# Patient Record
Sex: Male | Born: 1970
Health system: Southern US, Community
[De-identification: ages and names within clinical notes are randomized; demographics above are authoritative.]

## PROBLEM LIST (undated history)

## (undated) DIAGNOSIS — M1990 Unspecified osteoarthritis, unspecified site: Secondary | ICD-10-CM

## (undated) HISTORY — DX: Unspecified osteoarthritis, unspecified site: M19.90

---

## 2006-05-27 ENCOUNTER — Emergency Department (HOSPITAL_COMMUNITY): Admission: EM | Admit: 2006-05-27 | Discharge: 2006-05-27 | Payer: Self-pay | Admitting: Emergency Medicine

## 2016-02-16 ENCOUNTER — Encounter: Payer: Self-pay | Admitting: Family Medicine

## 2016-02-16 ENCOUNTER — Ambulatory Visit (INDEPENDENT_AMBULATORY_CARE_PROVIDER_SITE_OTHER): Payer: 59 | Admitting: Family Medicine

## 2016-02-16 VITALS — BP 110/75 | HR 90 | Temp 97.2°F | Ht 71.0 in | Wt 166.8 lb

## 2016-02-16 DIAGNOSIS — M62830 Muscle spasm of back: Secondary | ICD-10-CM

## 2016-02-16 DIAGNOSIS — Z0000 Encounter for general adult medical examination without abnormal findings: Secondary | ICD-10-CM | POA: Diagnosis not present

## 2016-02-16 DIAGNOSIS — D582 Other hemoglobinopathies: Secondary | ICD-10-CM

## 2016-02-16 DIAGNOSIS — D72829 Elevated white blood cell count, unspecified: Secondary | ICD-10-CM

## 2016-02-16 MED ORDER — TIZANIDINE HCL 4 MG PO CAPS: 4.0000 mg | ORAL_CAPSULE | Freq: Three times a day (TID) | ORAL | Status: DC | PRN

## 2016-02-16 MED ORDER — BACLOFEN 10 MG PO TABS: 10.0000 mg | ORAL_TABLET | Freq: Three times a day (TID) | ORAL | Status: DC

## 2016-02-16 MED ORDER — MELOXICAM 15 MG PO TABS: 15.0000 mg | ORAL_TABLET | Freq: Every day | ORAL | Status: DC

## 2016-02-16 NOTE — Progress Notes (Signed)
   HPI  Patient presents today to establish care with back spasms.  Expressive last 3 months he's had steadily worsening back pain.  He describes it as bilateral lower back pain that hurts with specific movements, he describes them as sharp stabbing type pains that go after a few moments, he feels them due to back spasms.  He was seen at urgent care about 2 weeks ago and treated with prednisone, Flexeril, and IM Depo-Medrol, he's only improved minimally.  He is a smoker, not willing to quit. He watches his diet carefully, he lost 50 pounds about a year ago. He exercises regularly at work but none outside of work.  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 [employer]  He had a plain film of the back 2 weeks ago at urgent care which showed only arthritis  PMH: Smoking status noted His past medical, surgical, social, family history reviewed and updated in EMR ROS: Per HPI  Objective: BP 110/75 mmHg  Pulse 90  Temp(Src) 97.2 F (36.2 C) (Oral)  Ht '5\' 11"'$  (1.803 m)  Wt 166 lb 12.8 oz (75.66 kg)  BMI 23.27 kg/m2 Gen: NAD, alert, cooperative with exam HEENT: NCAT, right TM obscured by cerumen, left TM normal, nares clear, oropharynx clear CV: RRR, good S1/S2, no murmur Resp: CTABL, no wheezes, non-labored Abd: SNTND, BS present, no guarding or organomegaly Ext: No edema, warm Neuro: Alert and oriented, strength 5/5 and sensation intact in bilateral lower extremities, 2+ patellar tendon reflexes, normal gait Musculoskeletal No tenderness to palpation of lumbar spine or paraspinal muscles  Assessment and plan:  # Back pain Likely muscle spasms Scheduled NSAIDs times a few weeks, also trybaclofen instead of Flexeril Heat and supportive care Stretching Basic labs Trial of vitamin B complex as well, can help muscle cramping Return in 3-4 weeks if not improving.  Annual Exam Normal exam Labs    Orders Placed This Encounter  Procedures  . CMP14+EGFR  .  CBC with Differential  . VITAMIN D 25 Hydroxy (Vit-D Deficiency, Fractures)  . Lipid Panel  . TSH    Meds ordered this encounter  Medications  . cyclobenzaprine (FLEXERIL) 10 MG tablet    Sig: Take 10 mg by mouth 3 (three) times daily as needed for muscle spasms.  . meloxicam (MOBIC) 15 MG tablet    Sig: Take 1 tablet (15 mg total) by mouth daily.    Dispense:  30 tablet    Refill:  0  . tiZANidine (ZANAFLEX) 4 MG capsule    Sig: Take 1 capsule (4 mg total) by mouth 3 (three) times daily as needed for muscle spasms.    Dispense:  60 capsule    Refill:  [place], MD [facility] 02/16/2016, 1:58 PM

## 2016-02-16 NOTE — Patient Instructions (Addendum)
Grea to meet you!  Try meloxicam daily for a few weeks, do not take any additional ibuprofen or aleve with it.   Try zanaflex as well, it may make you sleepy.   Lets follow up in 3-4 weeks to see how you are doing.   I would also recommend taking vitamin B complex (with at least 30 mg of B6) 3 times daily.   We will call within 1 week with your lab results.

## 2016-02-17 LAB — CMP14+EGFR
A/G RATIO: 1.9 (ref 1.2–2.2)
ALT: 11 IU/L (ref 0–44)
AST: 14 IU/L (ref 0–40)
Albumin: 4.6 g/dL (ref 3.5–5.5)
Alkaline Phosphatase: 100 IU/L (ref 39–117)
BUN/Creatinine Ratio: 16 (ref 9–20)
BUN: 14 mg/dL (ref 6–24)
Bilirubin Total: 0.3 mg/dL (ref 0.0–1.2)
CALCIUM: 9.4 mg/dL (ref 8.7–10.2)
CO2: 24 mmol/L (ref 18–29)
Chloride: 97 mmol/L (ref 96–106)
Creatinine, Ser: 0.87 mg/dL (ref 0.76–1.27)
GFR calc Af Amer: 120 mL/min/{1.73_m2} (ref >59)
GFR, EST NON AFRICAN AMERICAN: 103 mL/min/{1.73_m2} (ref >59)
GLUCOSE: 82 mg/dL (ref 65–99)
Globulin, Total: 2.4 g/dL (ref 1.5–4.5)
POTASSIUM: 4.7 mmol/L (ref 3.5–5.2)
Sodium: 140 mmol/L (ref 134–144)
TOTAL PROTEIN: 7 g/dL (ref 6.0–8.5)

## 2016-02-17 LAB — CBC WITH DIFFERENTIAL/PLATELET
BASOS ABS: 0 10*3/uL (ref 0.0–0.2)
BASOS: 0 %
EOS (ABSOLUTE): 0.2 10*3/uL (ref 0.0–0.4)
Eos: 2 %
HEMOGLOBIN: 18.6 g/dL — AB (ref 12.6–17.7)
Hematocrit: 51.9 % — ABNORMAL HIGH (ref 37.5–51.0)
IMMATURE GRANS (ABS): 0 10*3/uL (ref 0.0–0.1)
IMMATURE GRANULOCYTES: 0 %
LYMPHS: 20 %
Lymphocytes Absolute: 2.7 10*3/uL (ref 0.7–3.1)
MCH: 32.9 pg (ref 26.6–33.0)
MCHC: 35.8 g/dL — ABNORMAL HIGH (ref 31.5–35.7)
MCV: 92 fL (ref 79–97)
MONOCYTES: 4 %
Monocytes Absolute: 0.6 10*3/uL (ref 0.1–0.9)
NEUTROS PCT: 74 %
Neutrophils Absolute: 10.2 10*3/uL — ABNORMAL HIGH (ref 1.4–7.0)
PLATELETS: 281 10*3/uL (ref 150–379)
RBC: 5.66 x10E6/uL (ref 4.14–5.80)
RDW: 13.2 % (ref 12.3–15.4)
WBC: 13.8 10*3/uL — ABNORMAL HIGH (ref 3.4–10.8)

## 2016-02-17 LAB — LIPID PANEL
Chol/HDL Ratio: 3.7 ratio units (ref 0.0–5.0)
Cholesterol, Total: 145 mg/dL (ref 100–199)
HDL: 39 mg/dL — AB (ref >39)
LDL Calculated: 75 mg/dL (ref 0–99)
TRIGLYCERIDES: 154 mg/dL — AB (ref 0–149)
VLDL Cholesterol Cal: 31 mg/dL (ref 5–40)

## 2016-02-17 LAB — VITAMIN D 25 HYDROXY (VIT D DEFICIENCY, FRACTURES): VIT D 25 HYDROXY: 24.1 ng/mL — AB (ref 30.0–100.0)

## 2016-02-17 LAB — TSH: TSH: 1.55 u[IU]/mL (ref 0.450–4.500)

## 2016-02-19 ENCOUNTER — Other Ambulatory Visit: Payer: 59

## 2016-02-19 ENCOUNTER — Other Ambulatory Visit: Payer: Self-pay | Admitting: *Deleted

## 2016-02-19 DIAGNOSIS — D72829 Elevated white blood cell count, unspecified: Secondary | ICD-10-CM

## 2016-02-19 DIAGNOSIS — D582 Other hemoglobinopathies: Secondary | ICD-10-CM

## 2016-02-19 NOTE — Addendum Note (Signed)
Addended by: Tamera PuntWRAY, Keyna Blizard S on: 02/19/2016 09:09 AM   Modules accepted: Orders

## 2016-02-20 LAB — CBC WITH DIFFERENTIAL/PLATELET
Basophils Absolute: 0 10*3/uL (ref 0.0–0.2)
Basos: 0 %
EOS (ABSOLUTE): 0.3 10*3/uL (ref 0.0–0.4)
EOS: 2 %
HEMATOCRIT: 47.5 % (ref 37.5–51.0)
Hemoglobin: 16.8 g/dL (ref 12.6–17.7)
Immature Grans (Abs): 0 10*3/uL (ref 0.0–0.1)
Immature Granulocytes: 0 %
Lymphocytes Absolute: 2.8 10*3/uL (ref 0.7–3.1)
Lymphs: 19 %
MCH: 32.6 pg (ref 26.6–33.0)
MCHC: 35.4 g/dL (ref 31.5–35.7)
MCV: 92 fL (ref 79–97)
MONOS ABS: 0.9 10*3/uL (ref 0.1–0.9)
Monocytes: 6 %
NEUTROS PCT: 73 %
Neutrophils Absolute: 10.8 10*3/uL — ABNORMAL HIGH (ref 1.4–7.0)
Platelets: 253 10*3/uL (ref 150–379)
RBC: 5.15 x10E6/uL (ref 4.14–5.80)
RDW: 13.3 % (ref 12.3–15.4)
WBC: 14.8 10*3/uL — AB (ref 3.4–10.8)

## 2016-02-20 LAB — PATHOLOGIST SMEAR REVIEW
Path Rev PLTs: NORMAL
Path Rev RBC: NORMAL

## 2016-03-18 ENCOUNTER — Ambulatory Visit (INDEPENDENT_AMBULATORY_CARE_PROVIDER_SITE_OTHER): Payer: 59 | Admitting: Family Medicine

## 2016-03-18 ENCOUNTER — Encounter: Payer: Self-pay | Admitting: Family Medicine

## 2016-03-18 VITALS — BP 121/78 | HR 71 | Temp 98.1°F | Ht 71.0 in | Wt 165.2 lb

## 2016-03-18 DIAGNOSIS — E559 Vitamin D deficiency, unspecified: Secondary | ICD-10-CM | POA: Diagnosis not present

## 2016-03-18 DIAGNOSIS — M62830 Muscle spasm of back: Secondary | ICD-10-CM | POA: Diagnosis not present

## 2016-03-18 DIAGNOSIS — D72829 Elevated white blood cell count, unspecified: Secondary | ICD-10-CM | POA: Diagnosis not present

## 2016-03-18 MED ORDER — BACLOFEN 20 MG PO TABS
20.0000 mg | ORAL_TABLET | Freq: Three times a day (TID) | ORAL | Status: DC
Start: 2016-03-18 — End: 2016-04-15

## 2016-03-18 NOTE — Progress Notes (Signed)
   HPI  Patient presents today to follow-up for back pain, leukocytosis, and vitamin D deficiency.  Pain Improving slightly, he's got a light duty at work. He still has intermittent episodic mostly left-sided sharp lower thoracic/upper lumbar back pain. States that mostly it's better, however he does continue to have episodic spasms and pain. Meloxicam not really helping Baclofen doesn't seem to be helping much, however he has better overall.  Vitamin D deficiency Taking supplement as recommended.  Leukocytosis No signs of illness, cough, shortness of breath, rhinorrhea.  No problems with nocturia, weak stream, or urinary hesitancy  PMH: Smoking status noted ROS: Per HPI  Objective: BP 121/78 mmHg  Pulse 71  Temp(Src) 98.1 F (36.7 C) (Oral)  Ht 5\' 11"  (1.803 m)  Wt 165 lb 3.2 oz (74.934 kg)  BMI 23.05 kg/m2 Gen: NAD, alert, cooperative with exam HEENT: NCAT CV: RRR, good S1/S2, no murmur Resp: CTABL, no wheezes, non-labored Ext: No edema, warm Neuro: Alert and oriented, No gross deficits  MSK No tenderness to palp of her paraspinal muscles or lumbar/thoracic spine  Assessment and plan:  # Muscle spasm of the back Improving slightly Titrate baclofen, discontinue meloxicam Offered physical therapy, however with his job it's very difficult to pursue that. Consider MRI if still persistent at next visit.  # Leukocytosis No clear reason, consider physical stress given his difficult and physically challenging job Rechecking labs today Peripheral smear was also normal  # D deficiency Replacing with 1000 IU Vitamin D 3 daily   Meds ordered this encounter  Medications  . baclofen (LIORESAL) 20 MG tablet    Sig: Take 1 tablet (20 mg total) by mouth 3 (three) times daily.    Dispense:  90 tablet    Refill:  2    Murtis SinkSam Ticia Virgo, MD [facility] 03/18/2016, 3:23 PM

## 2016-03-18 NOTE — Patient Instructions (Signed)
Great to see you!  We will call with labs within 1 week  Increase the baclofen to 20 mg three times daily  Stop meloxicam for a few days, if you cant see a difference there is no need to take it.   Come back in 4 weeks or so

## 2016-03-19 LAB — CBC WITH DIFFERENTIAL/PLATELET
BASOS ABS: 0 10*3/uL (ref 0.0–0.2)
Basos: 0 %
EOS (ABSOLUTE): 0.2 10*3/uL (ref 0.0–0.4)
Eos: 2 %
HEMOGLOBIN: 17.1 g/dL (ref 12.6–17.7)
Hematocrit: 48.5 % (ref 37.5–51.0)
Immature Grans (Abs): 0 10*3/uL (ref 0.0–0.1)
Immature Granulocytes: 0 %
LYMPHS ABS: 2.8 10*3/uL (ref 0.7–3.1)
Lymphs: 21 %
MCH: 32.4 pg (ref 26.6–33.0)
MCHC: 35.3 g/dL (ref 31.5–35.7)
MCV: 92 fL (ref 79–97)
MONOS ABS: 0.6 10*3/uL (ref 0.1–0.9)
Monocytes: 5 %
NEUTROS ABS: 9.7 10*3/uL — AB (ref 1.4–7.0)
Neutrophils: 72 %
Platelets: 274 10*3/uL (ref 150–379)
RBC: 5.28 x10E6/uL (ref 4.14–5.80)
RDW: 13 % (ref 12.3–15.4)
WBC: 13.4 10*3/uL — AB (ref 3.4–10.8)

## 2016-04-02 ENCOUNTER — Telehealth: Payer: Self-pay | Admitting: Family Medicine

## 2016-04-02 NOTE — Telephone Encounter (Signed)
Appreciate FYI  Murtis SinkSam Kaleyah Labreck, MD [facility] 04/02/2016, 11:59 AM

## 2016-04-02 NOTE — Telephone Encounter (Signed)
Patient states that he started taking testosterone supplement OTC and it seems to be helping his condition some. Patient wanted us to let you know.

## 2016-04-15 ENCOUNTER — Encounter: Payer: Self-pay | Admitting: Family Medicine

## 2016-04-15 ENCOUNTER — Ambulatory Visit (INDEPENDENT_AMBULATORY_CARE_PROVIDER_SITE_OTHER): Payer: 59 | Admitting: Family Medicine

## 2016-04-15 VITALS — BP 104/70 | HR 77 | Temp 97.8°F | Ht 71.0 in | Wt 160.2 lb

## 2016-04-15 DIAGNOSIS — M545 Low back pain, unspecified: Secondary | ICD-10-CM

## 2016-04-15 DIAGNOSIS — D72829 Elevated white blood cell count, unspecified: Secondary | ICD-10-CM | POA: Diagnosis not present

## 2016-04-15 DIAGNOSIS — M546 Pain in thoracic spine: Secondary | ICD-10-CM | POA: Diagnosis not present

## 2016-04-15 LAB — CBC WITH DIFFERENTIAL/PLATELET
BASOS ABS: 0 10*3/uL (ref 0.0–0.2)
Basos: 0 %
EOS (ABSOLUTE): 0.3 10*3/uL (ref 0.0–0.4)
Eos: 2 %
HEMOGLOBIN: 17.6 g/dL (ref 12.6–17.7)
Hematocrit: 49.1 % (ref 37.5–51.0)
IMMATURE GRANS (ABS): 0 10*3/uL (ref 0.0–0.1)
Immature Granulocytes: 0 %
LYMPHS: 14 %
Lymphocytes Absolute: 2.2 10*3/uL (ref 0.7–3.1)
MCH: 33 pg (ref 26.6–33.0)
MCHC: 35.8 g/dL — AB (ref 31.5–35.7)
MCV: 92 fL (ref 79–97)
MONOCYTES: 7 %
Monocytes Absolute: 1 10*3/uL — ABNORMAL HIGH (ref 0.1–0.9)
Neutrophils Absolute: 11.8 10*3/uL — ABNORMAL HIGH (ref 1.4–7.0)
Neutrophils: 77 %
Platelets: 245 10*3/uL (ref 150–379)
RBC: 5.33 x10E6/uL (ref 4.14–5.80)
RDW: 13.1 % (ref 12.3–15.4)
WBC: 15.3 10*3/uL — AB (ref 3.4–10.8)

## 2016-04-15 MED ORDER — TIZANIDINE HCL 2 MG PO CAPS: 2.0000 mg | ORAL_CAPSULE | Freq: Three times a day (TID) | ORAL | Status: DC

## 2016-04-15 NOTE — Progress Notes (Signed)
HPI  Patient presents today for continued back pain.  Patient has been treated now for about 8 weeks with conservative therapy with not much improvement. He continues to have persistent midline lower thoracic and upper lumbar pain. He is pain is accompanied with severe intermittent muscle spasms.  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   He denies any fever, chills, sweats He's been taking 20-40 mg of baclofen daily with not much improvement. He also tried Flexeril and meloxicam with no much improvement.  Slight improvement when he stops working, he recently returned back to his usual job, he's been on light duty, and had recurrence of severe back spasms right away.  PMH: Smoking status noted ROS: Per HPI  Objective: BP 104/70 mmHg  Pulse 77  Temp(Src) 97.8 F (36.6 C) (Oral)  Ht  (1.803 m)  Wt 160 lb 3.2 oz (72.666 kg)  BMI 22.35 kg/m2 Gen: NAD, alert, cooperative with exam HEENT: NCAT CV: RRR, good S1/S2, no murmur Resp: CTABL, no wheezes, non-labored Ext: No edema, warm Neuro: Alert and oriented, strength 5/5 and sensation intact in bilateral lower extremities, 2+ patellar tendon reflexes Musculoskeletal No tenderness to palpation of midline thoracic or lumbar back, no tenderness to palpation of paraspinal muscles.  Assessment and plan:  # Persistent back pain Lumbar and thoracic, given no improvement with conservative therapy will order MRI to rule out more serious etiology Trial of tizanidine instead of baclofen, discussed weaning off of baclofen Recommended seeing sports medicine, Ayesha Mohair, for additional recommendations.  # Leukocytosis Elevated W BC, also hemoglobin initially, hemoglobin came down to the normal range and his WBC is prevent stable. No signs of active infection Continue to monitor  Orders Placed This Encounter  Procedures  . MR Thoracic Spine Wo Contrast    Standing Status: Future     Number of Occurrences:      Standing Expiration Date: 06/16/2017    Order Specific Question:  Reason for Exam (SYMPTOM  OR DIAGNOSIS REQUIRED)    Answer:  persistent chronic pain    Order Specific Question:  Preferred imaging location?    Answer:  [facility] (table limit-350lbs)    Order Specific Question:  What is the patient's sedation requirement?    Answer:  No Sedation    Order Specific Question:  Does the patient have a pacemaker or implanted devices?    Answer:  No  . MR Lumbar Spine Wo Contrast    Standing Status: Future     Number of Occurrences:      Standing Expiration Date: 06/16/2017    Order Specific Question:  Reason for Exam (SYMPTOM  OR DIAGNOSIS REQUIRED)    Answer:  persistent chronic back pain, lower thoracic/upper lumbar    Order Specific Question:  Preferred imaging location?    Answer:  [facility] (table limit-350lbs)    Order Specific Question:  What is the patient's sedation requirement?    Answer:  No Sedation    Order Specific Question:  Does the patient have a pacemaker or implanted devices?    Answer:  No  . CBC with Differential  . Ambulatory referral to Sports Medicine    Referral Priority:  Routine    Referral Type:  Consultation    Number of Visits Requested:  1    Meds ordered this encounter  Medications  . multivitamin-iron-minerals-folic acid (CENTRUM) chewable tablet    Sig: Dorna Bloom  1 tablet by mouth daily.  . tizanidine (ZANAFLEX) 2 MG capsule    Sig: Take 1 capsule (2 mg total) by mouth 3 (three) times daily.    Dispense:  30 capsule    Refill:  2    Murtis SinkSam Bradshaw, MD [facility] 04/15/2016, 10:58 AM

## 2016-04-15 NOTE — Patient Instructions (Signed)
Great to see you!  We will work on an MRI and the referral for sports medicine.   I have sent zanaflex for your spasms, you can use it as needed.

## 2016-04-29 ENCOUNTER — Ambulatory Visit (HOSPITAL_COMMUNITY)
Admission: RE | Admit: 2016-04-29 | Discharge: 2016-04-29 | Disposition: A | Discharge status: Home / Self Care | Payer: 59 | Source: Ambulatory Visit | Attending: Family Medicine | Admitting: Family Medicine

## 2016-04-29 DIAGNOSIS — M5126 Other intervertebral disc displacement, lumbar region: Secondary | ICD-10-CM | POA: Diagnosis not present

## 2016-04-29 DIAGNOSIS — M545 Low back pain, unspecified: Secondary | ICD-10-CM

## 2016-04-29 DIAGNOSIS — M546 Pain in thoracic spine: Secondary | ICD-10-CM

## 2016-04-29 DIAGNOSIS — M5124 Other intervertebral disc displacement, thoracic region: Secondary | ICD-10-CM | POA: Insufficient documentation

## 2016-04-29 DIAGNOSIS — M5137 Other intervertebral disc degeneration, lumbosacral region: Secondary | ICD-10-CM | POA: Diagnosis not present

## 2016-05-06 NOTE — Progress Notes (Signed)
[name] [address] [place], [place] [address] Phone: [phone] Subjective:    I'm seeing this patient by the request  of:  Kevin Fenton, MD   CC: Low back pain   [id number]  Drew West is a 45 y.o. male coming in with complaint of mid and low back pain    Already tried Baclofen 20mg  3 times daily, flexeril and gabapentin with minimal improvement. Went on light duty and  Was a little better but worse when starting job again. Patient has not been on light duty for approximately 10 weeks. Patient's on primary care provider has tried some over-the-counter medications and some mild exercises with very mild improvement. Was sent for x-rays showing very mild arthritic changes. Also sent for an MRI  MRI ordered.  Independently reviewed by me Thoracic chronic DDD.  Lumbar  1. Small broad-based disc bulge at L4-5 with a central annular fissure with minimal indentation upon the thecal sac without nerve root impingement. 2. Chronic degenerative disc disease at L5-S1 without neural Impingement.  Patient states that this seems to have significant tightness in the mornings. Seems to better throughout the day but unfortunately after approximately 2 or 3 PM in the afternoon he starts having increasing tightness again that can be significantly severe. Patient denies any significant radiation down the legs at the moment. Seems to stay localized in the lower back. Patient rates the severity of pain at all times is 3 out of 10 but can have severity is low bad is 8 out of 10. Has been debilitating. 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  Has had laboratory workup including CBC showing elevated white blood cell count but peripheral smear was unremarkable. Patient also found to have a low vitamin D level.  Past Medical History:  Diagnosis Date  . Arthritis    lower back, right ankle   No past surgical  history on file. Social History   Social History  . Marital status: Single    Spouse name: N/A  . Number of children: N/A  . Years of education: N/A   Social History Main Topics  . Smoking status: Current Every Day Smoker    Packs/day: 0.25    Types: Cigars  . Smokeless tobacco: None  . Alcohol use Yes  . Drug use: No  . Sexual activity: Not Asked   Other Topics Concern  . None   Social History Narrative  . None   Not on File Family History  Problem Relation Age of Onset  . Stroke Father   . Diabetes Father   . Heart disease Father   . Depression Sister   . Bipolar disorder Sister     Past medical history, social, surgical and family history all reviewed in electronic medical record.  No pertanent information unless stated regarding to the chief complaint.   Review of Systems: No headache, visual changes, nausea, vomiting, diarrhea, constipation, dizziness, abdominal pain, skin rash, fevers, chills, night sweats, weight loss, swollen lymph nodes, body aches, joint swelling, muscle aches, chest pain, shortness of breath, mood changes.   Objective  Blood pressure 112/74, pulse 80, height 5\' 11"  (1.803 m), weight 160 lb (72.6 kg), SpO2 95 %.  General: No apparent distress alert and oriented x3 mood and affect normal, dressed appropriately.  HEENT: Pupils equal, extraocular movements intact  Respiratory: Patient's speak in full sentences and does not appear short of breath  Cardiovascular: No  lower extremity edema, non tender, no erythema  Skin: Warm dry intact with no signs of infection or rash on extremities or on axial skeleton.  Abdomen: Soft nontender  Neuro: Cranial nerves II through XII are intact, neurovascularly intact in all extremities with 2+ DTRs and 2+ pulses.  Lymph: No lymphadenopathy of posterior or anterior cervical chain or axillae bilaterally.  Gait normal with good balance and coordination. She guarded and patient is slow from going from a seated to  standing position secondary to tightness. MSK:  Non tender with full range of motion and good stability and symmetric strength and tone of shoulders, elbows, wrist, hip, knee and ankles bilaterally.  Back Exam:  Inspection: Unremarkable  Motion: Flexion 30 deg, Extension 15 deg, Side Bending to 25 deg bilaterally,  Rotation to 25 deg bilaterally  SLR laying: Negative  XSLR laying: Negative  Palpable tenderness: Tenderness over the paraspinal musculature of the lumbar spine do feel the right side.Marland Kitchen FABER: Moderately positive on the left with tightness but pain on the right side of the back. Sensory change: Gross sensation intact to all lumbar and sacral dermatomes.  Reflexes: 2+ at both patellar tendons, 2+ at achilles tendons, Babinski's downgoing.  Strength at foot  Plantar-flexion: 5/5 Dorsi-flexion: 5/5 Eversion: 5/5 Inversion: 5/5  Leg strength  Quad: 5/5 Hamstring: 5/5 Hip flexor: 5/5 Hip abductors: 4/5  but symmetric     Impression and Recommendations:     This case required medical decision making of moderate complexity.      Note: This dictation was prepared with Dragon dictation along with smaller phrase technology. Any transcriptional errors that result from this process are unintentional.

## 2016-05-07 ENCOUNTER — Ambulatory Visit (INDEPENDENT_AMBULATORY_CARE_PROVIDER_SITE_OTHER): Payer: 59 | Admitting: Family Medicine

## 2016-05-07 ENCOUNTER — Encounter: Payer: Self-pay | Admitting: *Deleted

## 2016-05-07 ENCOUNTER — Encounter: Payer: Self-pay | Admitting: Family Medicine

## 2016-05-07 DIAGNOSIS — D72829 Elevated white blood cell count, unspecified: Secondary | ICD-10-CM | POA: Diagnosis not present

## 2016-05-07 DIAGNOSIS — M5136 Other intervertebral disc degeneration, lumbar region: Secondary | ICD-10-CM

## 2016-05-07 DIAGNOSIS — E559 Vitamin D deficiency, unspecified: Secondary | ICD-10-CM | POA: Diagnosis not present

## 2016-05-07 MED ORDER — PREDNISONE 50 MG PO TABS: 50.0000 mg | ORAL_TABLET | Freq: Every day | ORAL | 0 refills | Status: DC

## 2016-05-07 MED ORDER — GABAPENTIN 100 MG PO CAPS: 200.0000 mg | ORAL_CAPSULE | Freq: Every day | ORAL | 3 refills | Status: DC

## 2016-05-07 MED ORDER — VITAMIN D (ERGOCALCIFEROL) 1.25 MG (50000 UNIT) PO CAPS: 50000.0000 [IU] | ORAL_CAPSULE | ORAL | 0 refills | Status: DC

## 2016-05-07 NOTE — Assessment & Plan Note (Signed)
Started on once weekly vitamin D  

## 2016-05-07 NOTE — Assessment & Plan Note (Signed)
Patient does have some very small bulging disc as well as some degenerative disc disease at multiple levels of the lumbar spine and mild during the thoracic spine. Patient also had vitamin D deficiency and we will replace. Patient given prednisone as well as gabapentin to see if this will be make any significant improvement. Warned him about the white blood cell count and not to check a meantime in the next 2-3 weeks. We discussed with patient that this will likely decrease any inflammation that is contribute in. We discussed icing regimen, given home exercises that I think will be beneficial with range of motion. Patient will come back and see me again in 2 weeks. Patient will be put on light duty at this time. If doing better we can consider formal physical therapy. Worsening we'll consider an epidural at L3-L4. Also would consider further laboratory workup to make sure there is no significant underlying pathology that could also be contributing with patient fairly significant arthritis for his age.

## 2016-05-07 NOTE — Assessment & Plan Note (Signed)
Being followed by primary care provider.

## 2016-05-07 NOTE — Patient Instructions (Addendum)
Good to see you.  Ice 20 minutes 2 times daily. Usually after activity and before bed. Exercises 3 times a week. I would consider physical therapy as well if you have trouble with the exercises on your own.  I would like to try Prednisone daily for 5 days Gabapentin 200mg  at night Once weekly vitamin D for next 12 weeks.  Then over the counter get Turmeric 500mg  twice daily and take after the prednisone.  See me again in 2 weeks but if not better in 1 week call me and we will order the epidural

## 2016-05-20 NOTE — Progress Notes (Signed)
Drew West D.O. [facility] [address] [place], [place] [address] Phone: [phone] Subjective:    I'm seeing this patient by the request  of:  Drew FentonSamuel Bradshaw, MD   CC: Low back pain Follow-up  BJY:[id number]:Subjective  Drew HuhJonathan West is a 45 y.o. male coming in with complaint of mid and low back pain    Already tried Baclofen 20mg  3 times daily, flexeril and gabapentin with minimal improvement. Went on light duty and  Was a little better but worse when starting job again. Patient has not been on light duty for approximately 10 weeks. Patient's on primary care provider has tried some over-the-counter medications and some mild exercises with very mild improvement. Was sent for x-rays showing very mild arthritic changes. Also sent for an MRI  MRI ordered.  Independently reviewed by me Thoracic chronic DDD.  Lumbar   fissure with minimal indentation upon the thecal sac without nerveroot impingement. 2. Chronic degenerative disc disease at L5-S1 without neural Impingement.  Patient was having more tightness overall. Patient was given once weekly vitamin D, prednisone and gabapentin. Patient states when he was on the prednisone significantly better. Now only approximate tendon 20% better. Since then he has days when her good and days when her back. Still seems to be localized. Has been doing light duty still at work.  Has had laboratory workup including CBC showing elevated white blood cell count but peripheral smear was unremarkable. Patient also found to have a low vitamin D level.  Past Medical History:  Diagnosis Date  . Arthritis    lower back, right ankle   No past surgical history on file. Social History   Social History  . Marital status: Single    Spouse name: N/A  . Number of children: N/A  . Years of education: N/A   Social History Main Topics  . Smoking status: Current Every Day Smoker    Packs/day: 0.25    Types: Cigars  . Smokeless tobacco: None  .  Alcohol use Yes  . Drug use: No  . Sexual activity: Not Asked   Other Topics Concern  . None   Social History Narrative  . None   Not on File Family History  Problem Relation Age of Onset  . Stroke Father   . Diabetes Father   . Heart disease Father   . Depression Sister   . Bipolar disorder Sister     Past medical history, social, surgical and family history all reviewed in electronic medical record.  No pertanent information unless stated regarding to the chief complaint.   Review of Systems: No headache, visual changes, nausea, vomiting, diarrhea, constipation, dizziness, abdominal pain, skin rash, fevers, chills, night sweats, weight loss, swollen lymph nodes, body aches, joint swelling, muscle aches, chest pain, shortness of breath, mood changes.   Objective  Blood pressure 114/72, pulse 80, weight 159 lb (72.1 kg), SpO2 95 %.  General: No apparent distress alert and oriented x3 mood and affect normal, dressed appropriately.  HEENT: Pupils equal, extraocular movements intact  Respiratory: Patient's speak in full sentences and does not appear short of breath  Cardiovascular: No lower extremity edema, non tender, no erythema  Skin: Warm dry intact with no signs of infection or rash on extremities or on axial skeleton.  Abdomen: Soft nontender  Neuro: Cranial nerves II through XII are intact, neurovascularly intact in all extremities with 2+ DTRs and 2+ pulses.  Lymph: No lymphadenopathy of posterior or anterior cervical chain or axillae bilaterally.  Gait normal with good balance and coordination. She guarded and patient is slow from going from a seated to standing position secondary to tightness. MSK:  Non tender with full range of motion and good stability and symmetric strength and tone of shoulders, elbows, wrist, hip, knee and ankles bilaterally.  Back Exam:  Inspection: Unremarkable  Motion: Flexion 30 deg, Extension 15 deg, Side Bending to 25 deg bilaterally,    Rotation to 25 deg bilaterally No improvement in range of motion SLR laying: Negative  XSLR laying: Negative  Palpable tenderness: Tenderness over the paraspinal musculature of the lumbar spine still present with mild changes. FABER: Positive left Sensory change: Gross sensation intact to all lumbar and sacral dermatomes.  Reflexes: 2+ at both patellar tendons, 2+ at achilles tendons, Babinski's downgoing.  Strength at foot  Plantar-flexion: 5/5 Dorsi-flexion: 5/5 Eversion: 5/5 Inversion: 5/5  Leg strength  Quad: 5/5 Hamstring: 5/5 Hip flexor: 5/5 Hip abductors: 4/5  but symmetric  Very minimal change from previous exam.    Impression and Recommendations:     This case required medical decision making of moderate complexity.      Note: This dictation was prepared with Dragon dictation along with smaller phrase technology. Any transcriptional errors that result from this process are unintentional.

## 2016-05-21 ENCOUNTER — Ambulatory Visit (INDEPENDENT_AMBULATORY_CARE_PROVIDER_SITE_OTHER): Payer: 59 | Admitting: Family Medicine

## 2016-05-21 ENCOUNTER — Other Ambulatory Visit (INDEPENDENT_AMBULATORY_CARE_PROVIDER_SITE_OTHER): Payer: 59

## 2016-05-21 ENCOUNTER — Encounter: Payer: Self-pay | Admitting: Family Medicine

## 2016-05-21 VITALS — BP 114/72 | HR 80 | Wt 159.0 lb

## 2016-05-21 DIAGNOSIS — M5136 Other intervertebral disc degeneration, lumbar region: Secondary | ICD-10-CM

## 2016-05-21 DIAGNOSIS — M2550 Pain in unspecified joint: Secondary | ICD-10-CM

## 2016-05-21 LAB — TESTOSTERONE: Testosterone: 210.2 ng/dL — ABNORMAL LOW (ref 300.00–890.00)

## 2016-05-21 LAB — C-REACTIVE PROTEIN: CRP: 0.5 mg/dL (ref 0.5–20.0)

## 2016-05-21 LAB — SEDIMENTATION RATE: SED RATE: 17 mm/h — AB (ref 0–15)

## 2016-05-21 NOTE — Patient Instructions (Signed)
God to see you  Gustavus Bryantce is your friend Keep up with the exercises We will get the epidural Duexis up to 3 times daily  We will get labs downstairs as well.  See me again 2 weeks after the injection and we will make sure you are improving.

## 2016-05-21 NOTE — Assessment & Plan Note (Signed)
Discussed with patient at great length. At this time we will get labs to further evaluate for this and back pain that does not seem to be responding to conservative therapy. Patient will continue with the once weekly vitamin D. Patient can continue with the gabapentin but states that it was hurting him stomach. We discussed with patient that we would like to consider doing epidural. Patient did have disorder. I hope that this will be beneficial. Patient come back again in 2-3 weeks after the epidural we'll see how patient response.

## 2016-05-22 ENCOUNTER — Telehealth: Payer: Self-pay | Admitting: Emergency Medicine

## 2016-05-22 LAB — PSA, TOTAL AND FREE
PSA, % FREE: 40 % (ref >25)
PSA, Free: 0.2 ng/mL
PSA, Total: 0.5 ng/mL (ref <=4.0)

## 2016-05-22 LAB — ANA: Anti Nuclear Antibody(ANA): NEGATIVE

## 2016-05-22 LAB — RHEUMATOID FACTOR: Rhuematoid fact SerPl-aCnc: <10 IU/mL (ref <=14)

## 2016-05-22 NOTE — Telephone Encounter (Signed)
Pt called and stated DR Katrinka BlazingSmith was suppose to write him a prescription yesterday but the pharmacy hasnt received anything. Can you look and see what he is talking about. Thanks.

## 2016-05-23 ENCOUNTER — Encounter: Payer: Self-pay | Admitting: Family Medicine

## 2016-05-23 ENCOUNTER — Encounter: Payer: Self-pay | Admitting: *Deleted

## 2016-05-23 MED ORDER — IBUPROFEN-FAMOTIDINE 800-26.6 MG PO TABS: ORAL_TABLET | ORAL | 1 refills | Status: DC

## 2016-05-23 NOTE — Telephone Encounter (Signed)
duexis sent into pharmacy.

## 2016-05-27 ENCOUNTER — Encounter: Payer: Self-pay | Admitting: Family Medicine

## 2016-05-27 NOTE — Progress Notes (Signed)
All labs for auto immune normal.  Sed rate is minor elevated but likely smoking.  Testosterone only one low.

## 2016-05-28 ENCOUNTER — Encounter: Payer: Self-pay | Admitting: Family Medicine

## 2016-05-29 ENCOUNTER — Ambulatory Visit
Admission: RE | Admit: 2016-05-29 | Discharge: 2016-05-29 | Disposition: A | Discharge status: Home / Self Care | Payer: 59 | Source: Ambulatory Visit | Attending: Family Medicine | Admitting: Family Medicine

## 2016-05-29 DIAGNOSIS — M5136 Other intervertebral disc degeneration, lumbar region: Secondary | ICD-10-CM

## 2016-05-29 MED ORDER — METHYLPREDNISOLONE ACETATE 40 MG/ML INJ SUSP (RADIOLOG
120.0000 mg | Freq: Once | INTRAMUSCULAR | Status: AC
  Administered 2016-05-29: 120 mg via EPIDURAL

## 2016-05-29 MED ORDER — IOPAMIDOL (ISOVUE-M 200) INJECTION 41%
1.0000 mL | Freq: Once | INTRAMUSCULAR | Status: AC
  Administered 2016-05-29: 1 mL via EPIDURAL

## 2016-05-29 NOTE — Discharge Instructions (Signed)

## 2016-06-06 NOTE — Progress Notes (Signed)
Drew ScaleZach West D.O. [facility] [address] [place], [place] [address] Phone: [phone] Subjective:    I'm seeing this patient by the request  of:  Kevin FentonSamuel Bradshaw, MD   CC: Low back pain Follow-up  OZH:[id number]:Subjective  Drew HuhJonathan West is a 45 y.o. male coming in with complaint of mid and low back pain    Already tried Baclofen 20mg  3 times daily, flexeril and gabapentin with minimal improvement. Went on light duty and  Was a little better but worse when starting job again. Patient has not been on light duty for approximately 10 weeks. Patient's on primary care provider has tried some over-the-counter medications and some mild exercises with very mild improvement. Was sent for x-rays showing very mild arthritic changes. Also sent for an MRI  MRI ordered.  Independently reviewed by me Thoracic chronic DDD. Lumbar . 2. Chronic degenerative disc disease at L5-S1 without neural Impingement. Patient elected to have an epidural. States that he is feeling 60-70% better. This has been for quite some time. Patient started doing some increasing activities at work.  Laboratory workup also showed patient had a low testosterone level.  Has had laboratory workup including CBC showing elevated white blood cell count but peripheral smear was unremarkable. Patient also found to have a low vitamin D level.  Past Medical History:  Diagnosis Date  . Arthritis    lower back, right ankle   No past surgical history on file. Social History   Social History  . Marital status: Single    Spouse name: N/A  . Number of children: N/A  . Years of education: N/A   Social History Main Topics  . Smoking status: Current Every Day Smoker    Packs/day: 0.25    Types: Cigars  . Smokeless tobacco: Not on file  . Alcohol use Yes  . Drug use: No  . Sexual activity: Not on file   Other Topics Concern  . Not on file   Social History Narrative  . No narrative on file   No Known Allergies Family  History  Problem Relation Age of Onset  . Stroke Father   . Diabetes Father   . Heart disease Father   . Depression Sister   . Bipolar disorder Sister     Past medical history, social, surgical and family history all reviewed in electronic medical record.  No pertanent information unless stated regarding to the chief complaint.   Review of Systems: No headache, visual changes, nausea, vomiting, diarrhea, constipation, dizziness, abdominal pain, skin rash, fevers, chills, night sweats, weight loss, swollen lymph nodes, body aches, joint swelling, muscle aches, chest pain, shortness of breath, mood changes.   Objective  There were no vitals taken for this visit.  General: No apparent distress alert and oriented x3 mood and affect normal, dressed appropriately.  HEENT: Pupils equal, extraocular movements intact  Respiratory: Patient's speak in full sentences and does not appear short of breath  Cardiovascular: No lower extremity edema, non tender, no erythema  Skin: Warm dry intact with no signs of infection or rash on extremities or on axial skeleton.  Abdomen: Soft nontender  Neuro: Cranial nerves II through XII are intact, neurovascularly intact in all extremities with 2+ DTRs and 2+ pulses.  Lymph: No lymphadenopathy of posterior or anterior cervical chain or axillae bilaterally.  Gait normal with good balance and coordination. She guarded and patient is slow from going from a seated to standing position secondary to tightness. MSK:  Non tender with  full range of motion and good stability and symmetric strength and tone of shoulders, elbows, wrist, hip, knee and ankles bilaterally.  Back Exam:  Inspection: Unremarkable  Motion: Flexion 30 deg, Extension 20 deg, Side Bending to 29 deg bilaterally,  Rotation to 29 deg bilaterally improvement in range of motion SLR laying: Negative  XSLR laying: Negative  Palpable tenderness: Minimal tenderness to palpation in the [facility] bone  musculature. Improvement FABER: Positive left Sensory change: Gross sensation intact to all lumbar and sacral dermatomes.  Reflexes: 2+ at both patellar tendons, 2+ at achilles tendons, Babinski's downgoing.  Strength at foot  Plantar-flexion: 5/5 Dorsi-flexion: 5/5 Eversion: 5/5 Inversion: 5/5  Leg strength  Quad: 5/5 Hamstring: 5/5 Hip flexor: 5/5 Hip abductors: 4/5  but symmetric Improvement from previous exam.    Impression and Recommendations:     This case required medical decision making of moderate complexity.      Note: This dictation was prepared with Dragon dictation along with smaller phrase technology. Any transcriptional errors that result from this process are unintentional.

## 2016-06-07 ENCOUNTER — Ambulatory Visit (INDEPENDENT_AMBULATORY_CARE_PROVIDER_SITE_OTHER): Payer: 59 | Admitting: Family Medicine

## 2016-06-07 ENCOUNTER — Encounter: Payer: Self-pay | Admitting: *Deleted

## 2016-06-07 ENCOUNTER — Encounter: Payer: Self-pay | Admitting: Family Medicine

## 2016-06-07 DIAGNOSIS — M5136 Other intervertebral disc degeneration, lumbar region: Secondary | ICD-10-CM

## 2016-06-07 NOTE — Assessment & Plan Note (Signed)
Responded fairly well to the epidural steroid injection. We discussed with patient that we will release him to full duty and see how he does. We discussed icing regimen and home exercises. We discussed which activities to do an which was to avoid. Patient has any worsening spasms we also discussed the possibility of treating his low testosterone and he will discuss with primary care provider. Patient and will come back and see me again in 4-6 weeks if not completely resolved.  Spent  25 minutes with patient face-to-face and had greater than 50% of counseling including as described above in assessment and plan. Discuss once again ergonomics as well as proper lifting techniques for his degenerative disc disease.

## 2016-06-07 NOTE — Patient Instructions (Signed)
Good to see you  You are doing so much better DHEA 50 mg daily for next month.  Talk to Dr. Ermalinda MemosBradshaw about your testosterone and see what he thinks.  Note for work given  We can repeat injection or the prednisone if worsening pain  Try to titrate off the duexis if you can.  Anytime worsening pain then go 3 times a day for 3 days.  Write me or see me when you need me.

## 2017-01-10 IMAGING — MR MR THORACIC SPINE W/O CM
4 of 7 series · 16 of 48 positions shown · non-contrast
Comparison: Chest x-ray dated 05/27/2006

CLINICAL DATA: Chronic thoracic and lumbar back pain for 10 months.

EXAM:
MRI THORACIC SPINE WITHOUT CONTRAST
TECHNIQUE: Multiplanar, multisequence MR imaging of the thoracic spine was
performed. No intravenous contrast was administered.

[Series 6: T1 · sagittal · 4.0mm · 0.78mm/px · 3 of 13 slices shown]
[im 1/13]
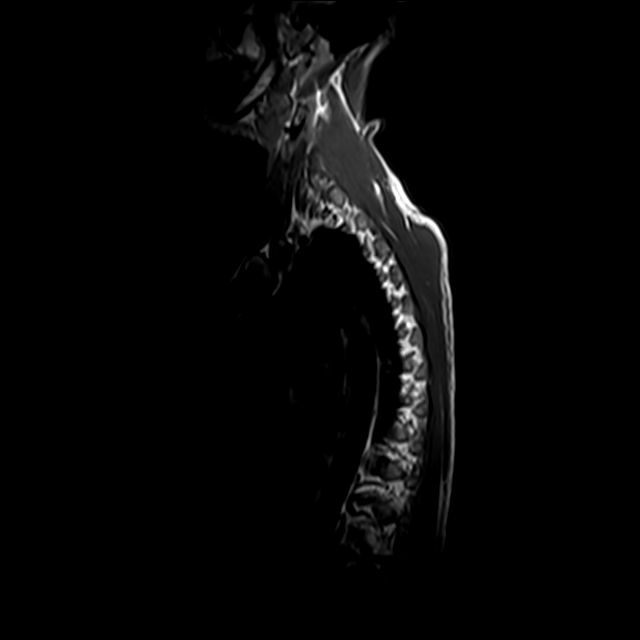
[im 9/13]
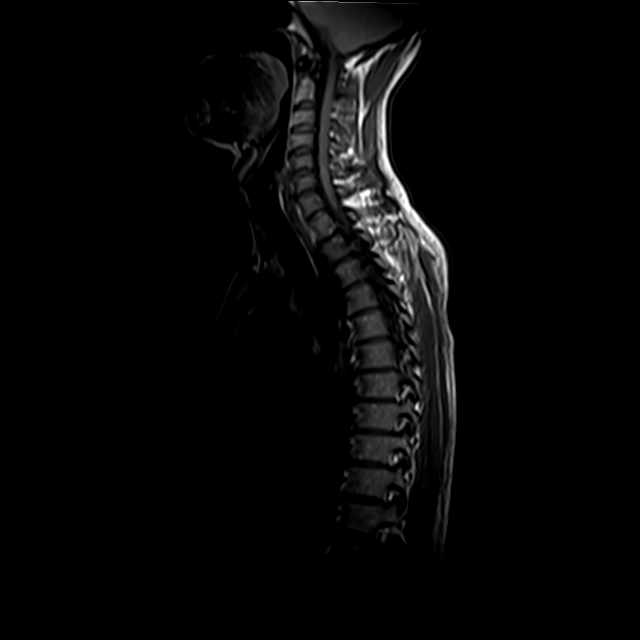
[im 13/13]
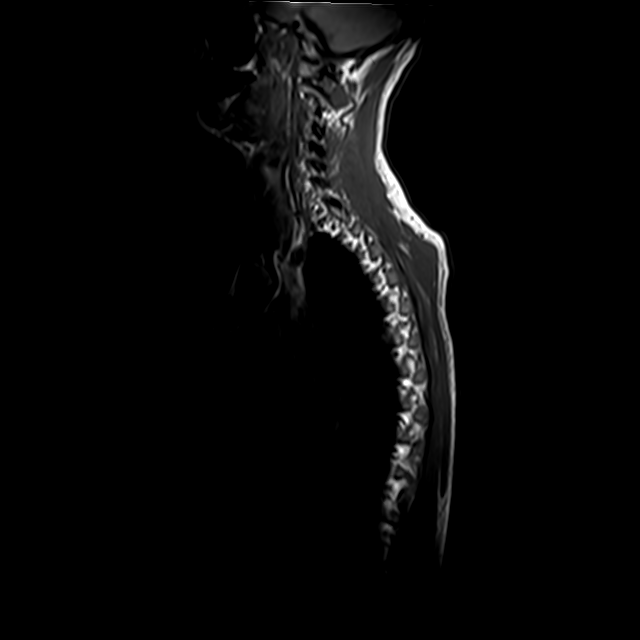

[Series 7: T2 · sagittal · 4.0mm · 0.55mm/px · 4 of 13 slices shown (1 of 3)]
[im 1/13]
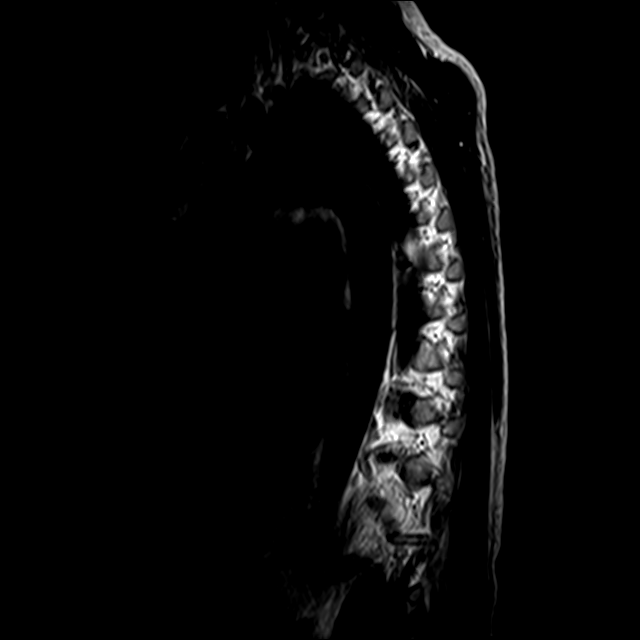
[im 5/13]
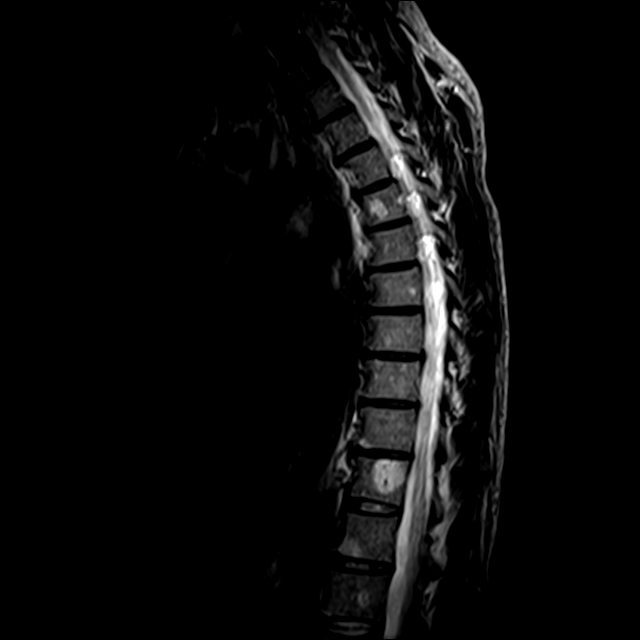
[im 9/13]
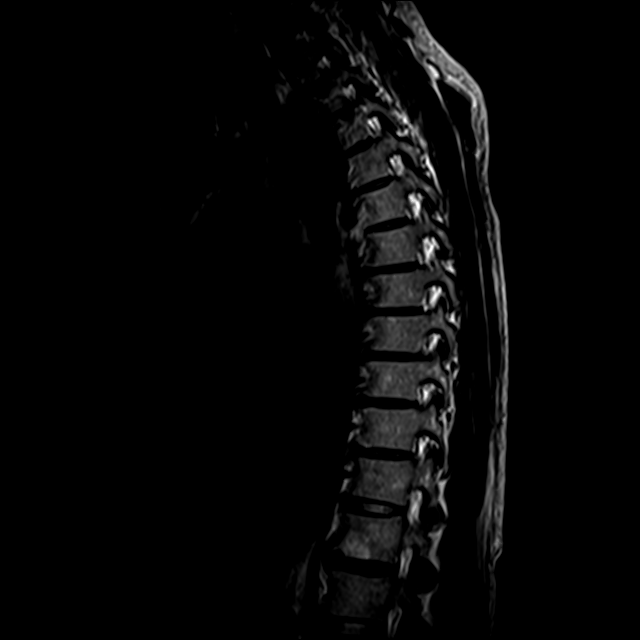
[im 13/13]
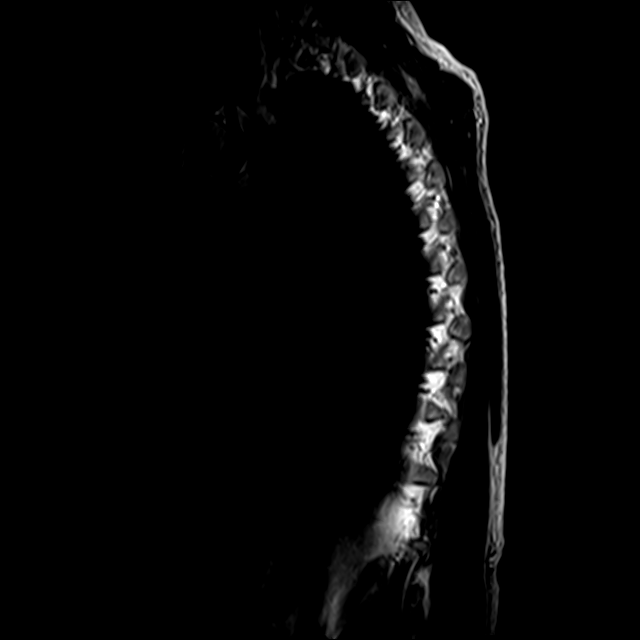

[Series 11: T2 · axial · 3.0mm · 0.27mm/px · z∈[-296,-79]mm · 6 of 39 slices shown (2 of 3)]
[im 1/39]
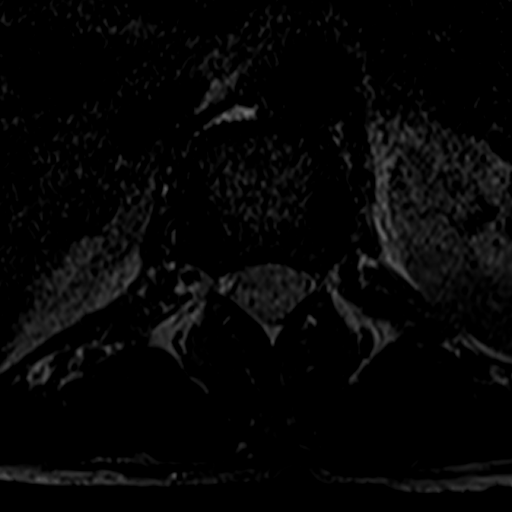
[im 7/39]
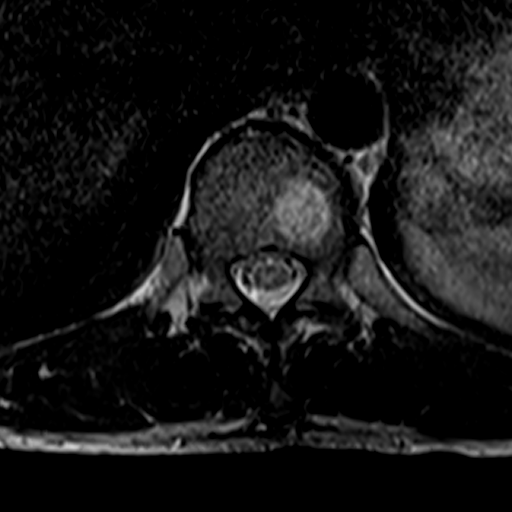
[im 13/39]
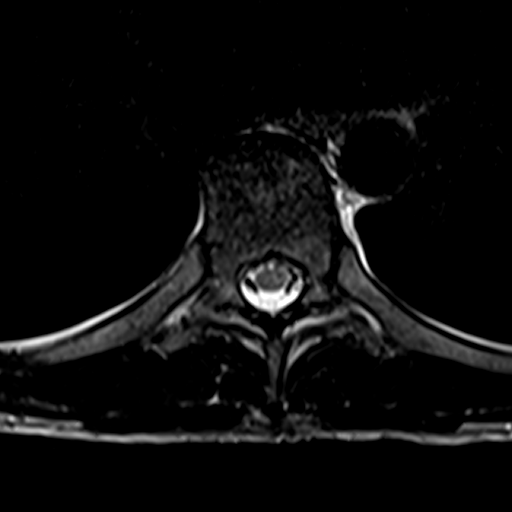
[im 16/39]
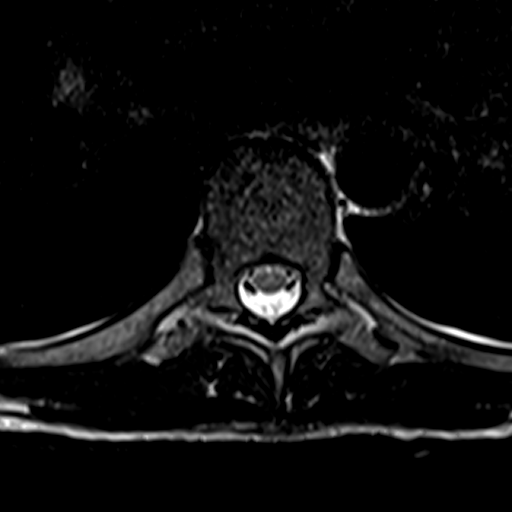
[im 20/39]
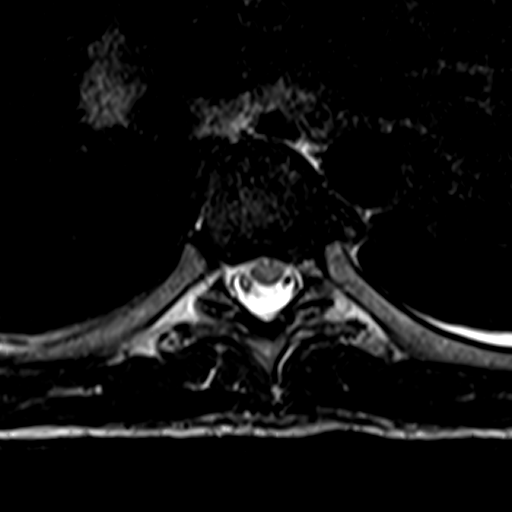
[im 32/39]
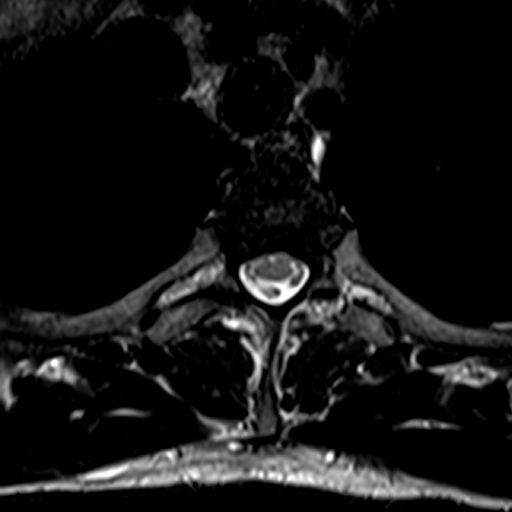

[Series 12: T2 · axial · 3.0mm · 0.27mm/px · z∈[-266,-224]mm · 3 of 14 slices shown (3 of 3)]
[im 1/14]
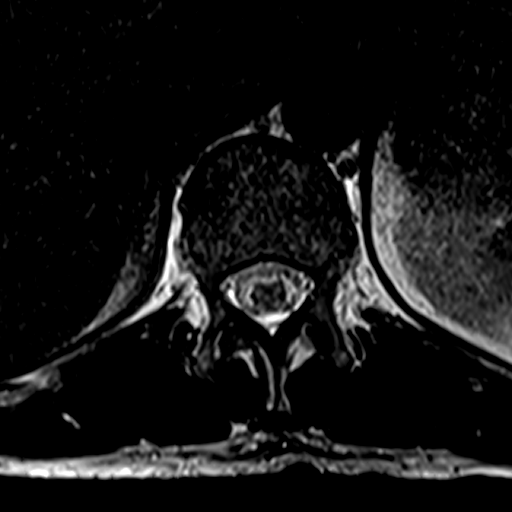
[im 7/14]
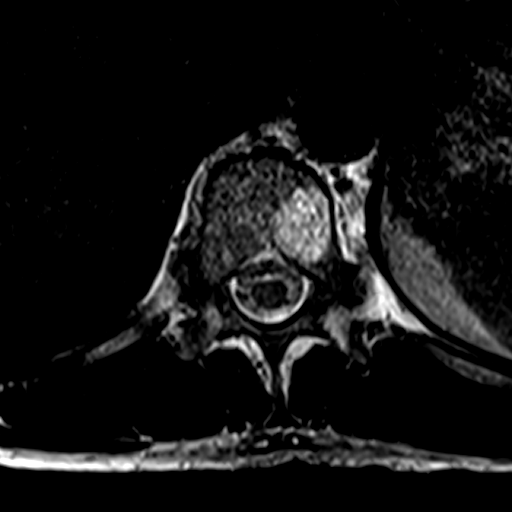
[im 14/14]
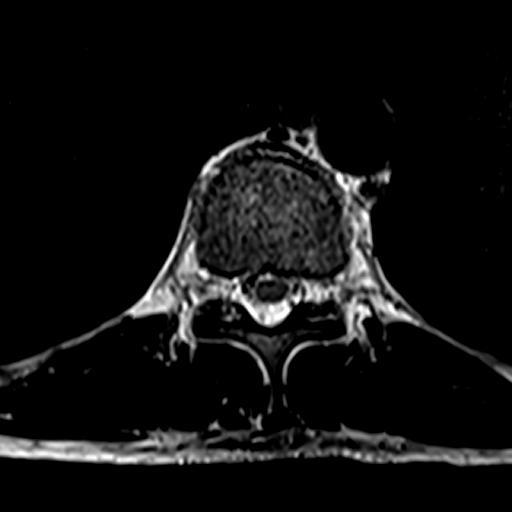

[16 of 48 positions shown; findings below may reference images not displayed]

FINDINGS: Alignment:  Normal.

Vertebrae: Benign hemangiomata in T6, T9, T11 and L1. Chronic
degenerative changes of the inferior endplate of T12.

Cord:  Normal.

Paraspinal and other soft tissues: Normal.

Disc levels:

C7-T1 through T7-8: No disc protrusions or significant disc bulges.
Disc space narrowing throughout the thoracic spine with disc
desiccation. No foraminal or spinal stenosis or facet arthritis.

T8-9: Tiny disc protrusion to the right of midline without neural
impingement. T9-10 and T10-11: Chronic disc space narrowing with no
neural impingement.

T11-12: Normal disc.

T12-L1: Chronic degenerative changes of the inferior endplate of
T12. Slight disc space narrowing. No disc bulging or protrusion.
IMPRESSION: Small focal soft disc protrusion at T8-9 to the right without neural
impingement.

Diffuse chronic disc space narrowing and disc desiccation throughout
the majority of the thoracic spine without significant disc bulging
or neural impingement.

## 2017-02-06 NOTE — Progress Notes (Signed)
[facility] [address] [place], [place] [address] Phone: [phone] Subjective:    I'm seeing this patient by the request  of:    CC: Back pain [id number]  Drew West is a 46 y.o. male coming in with complaint of severe back pain. We have seen patient for this problem previously. Last office visit was ground 8 months ago. Patient was found to have chronic degenerative disc disease at L5-S1. Patient elected to have an epidural at that time and was feeling approximate 70% better. Patient was starting to increase activity slowly. Patient states Starting to have worsening pain. Patient states that it feels very similar to what it was previously. Starting have some mild radicular symptoms. Patient does not want to get as severe as what it was previously.     Past Medical History:  Diagnosis Date  . Arthritis    lower back, right ankle   History reviewed. No pertinent surgical history. Social History   Social History  . Marital status: Single    Spouse name: N/A  . Number of children: N/A  . Years of education: N/A   Social History Main Topics  . Smoking status: Current Every Day Smoker    Packs/day: 0.25    Types: Cigars  . Smokeless tobacco: Never Used  . Alcohol use Yes  . Drug use: No  . Sexual activity: Not Asked   Other Topics Concern  . None   Social History Narrative  . None   No Known Allergies Family History  Problem Relation Age of Onset  . Stroke Father   . Diabetes Father   . Heart disease Father   . Depression Sister   . Bipolar disorder Sister     Past medical history, social, surgical and family history all reviewed in electronic medical record.  No pertanent information unless stated regarding to the chief complaint.   Review of Systems:Review of systems updated and as accurate as of 02/07/17  No headache, visual changes, nausea, vomiting, diarrhea, constipation, dizziness, abdominal pain, skin rash, fevers,  chills, night sweats, weight loss, swollen lymph nodes, body aches, joint swelling, muscle aches, chest pain, shortness of breath, mood changes.   Objective  Blood pressure 120/82, pulse 85, resp. rate 16, weight 153 lb (69.4 kg), SpO2 96 %. Systems examined below as of 02/07/17   General: No apparent distress alert and oriented x3 mood and affect normal, dressed appropriately.  HEENT: Pupils equal, extraocular movements intact  Respiratory: Patient's speak in full sentences and does not appear short of breath  Cardiovascular: No lower extremity edema, non tender, no erythema  Skin: Warm dry intact with no signs of infection or rash on extremities or on axial skeleton.  Abdomen: Soft nontender  Neuro: Cranial nerves II through XII are intact, neurovascularly intact in all extremities with 2+ DTRs and 2+ pulses.  Lymph: No lymphadenopathy of posterior or anterior cervical chain or axillae bilaterally.  Gait normal with good balance and coordination.  MSK:  Non tender with full range of motion and good stability and symmetric strength and tone of shoulders, elbows, wrist, hip, knee and ankles bilaterally Back Exam:  Inspection: oss of lordosis Motion: Flexion 35 deg, Extension 25 deg, Side Bending to 35 deg bilaterally,  Rotation to 25 deg bilaterally  SLR laying: Positive left XSLR laying: Negative  Palpable tenderness: Tender to palpation and appears palmar suture of the lumbar spine.[place] Kitchen FABER: negative. Sensory change: Gross sensation intact to all lumbar and sacral  dermatomes.  Reflexes: 2+ at both patellar tendons, 2+ at achilles tendons, Babinski's downgoing.  Strength at foot  Plantar-flexion: 5/5 Dorsi-flexion: 5/5 Eversion: 5/5 Inversion: 5/5  Leg strength  Quad: 5/5 Hamstring: 5/5 Hip flexor: 5/5 Hip abductors: 4/5 but symmetric Gait unremarkable. .     Impression and Recommendations:     This case required medical decision making of moderate complexity.      Note:  This dictation was prepared with Dragon dictation along with smaller phrase technology. Any transcriptional errors that result from this process are unintentional.

## 2017-02-07 ENCOUNTER — Ambulatory Visit (INDEPENDENT_AMBULATORY_CARE_PROVIDER_SITE_OTHER): Payer: 59 | Admitting: Family Medicine

## 2017-02-07 ENCOUNTER — Encounter: Payer: Self-pay | Admitting: Family Medicine

## 2017-02-07 VITALS — BP 120/82 | HR 85 | Resp 16 | Wt 153.0 lb

## 2017-02-07 DIAGNOSIS — M5136 Other intervertebral disc degeneration, lumbar region: Secondary | ICD-10-CM

## 2017-02-07 DIAGNOSIS — M549 Dorsalgia, unspecified: Secondary | ICD-10-CM | POA: Diagnosis not present

## 2017-02-07 MED ORDER — KETOROLAC TROMETHAMINE 60 MG/2ML IM SOLN
60.0000 mg | Freq: Once | INTRAMUSCULAR | Status: AC
  Administered 2017-02-07: 60 mg via INTRAMUSCULAR

## 2017-02-07 MED ORDER — PREDNISONE 50 MG PO TABS: 50.0000 mg | ORAL_TABLET | Freq: Every day | ORAL | 0 refills | Status: DC

## 2017-02-07 MED ORDER — METHYLPREDNISOLONE ACETATE 80 MG/ML IJ SUSP
80.0000 mg | Freq: Once | INTRAMUSCULAR | Status: AC
  Administered 2017-02-07: 80 mg via INTRAMUSCULAR

## 2017-02-07 NOTE — Patient Instructions (Signed)
Good to see you  Drew West is your friend. Ice 20 minutes 2 times daily. Usually after activity and before bed. Light duty for next week.  2 injections today  Starting tomorrow prednsione daily for 5 days  After the 5 days restart the Duexis Send me a message in 1 week and if ot better lets reschedule the epidural

## 2017-02-07 NOTE — Assessment & Plan Note (Signed)
Patient has had a disc bulge previously. Did respond very well to an epidural at L3-L4 previously. At this point. Patient is going to start increasing activity and was given prednisone for 5 days. Patient will put on light duty at work. Patient has responded as previously and hopefully we'll be beneficial. We discussed icing regimen. Patient will give us a message in 1 week. If no significant improvement we'll have patient given another epidural.

## 2017-02-09 IMAGING — XA Imaging study
2 series · 2 of 2 positions shown · non-contrast
Comparison: none

CLINICAL DATA: Spondylosis without myelopathy. Disc degeneration
L5-S1 with advanced discogenic edema.

[Series 1: ortho standard · 1 of 1 slices shown (1 of 2)]
[im 1/1]
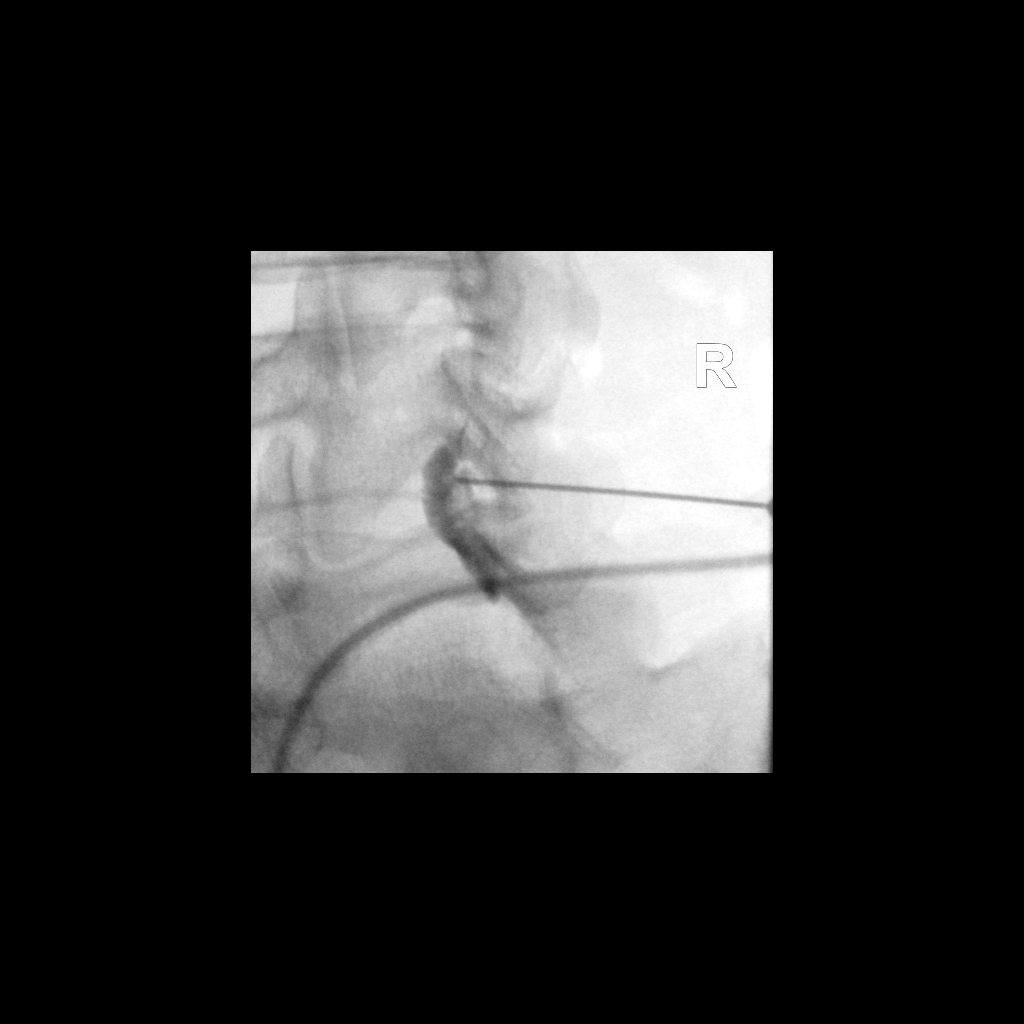

[Series 2: ortho standard · 1 of 1 slices shown (2 of 2)]
[im 1/1]
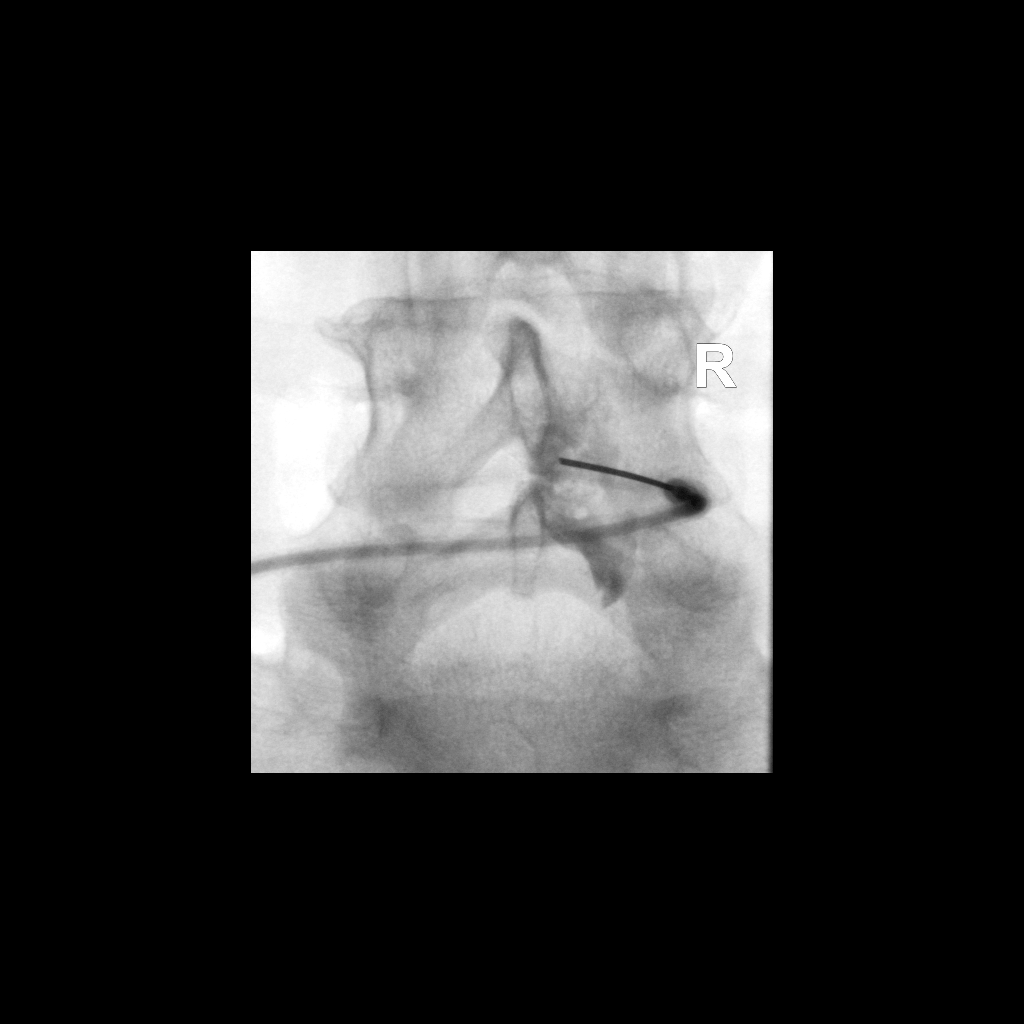

[2 of 2 positions shown; findings below may reference images not displayed]

FLUOROSCOPY TIME:  0 minutes 19 seconds. 31.53 micro gray meter
squared

PROCEDURE:
The procedure, risks, benefits, and alternatives were explained to
the patient. Questions regarding the procedure were encouraged and
answered. The patient understands and consents to the procedure.

LUMBAR EPIDURAL INJECTION:

An interlaminar approach was performed on right to midline at L4-5.
The overlying skin was cleansed and anesthetized. A 20 gauge
epidural needle was advanced using loss-of-resistance technique.

DIAGNOSTIC EPIDURAL INJECTION:

Injection of Isovue-M 200 shows a good epidural pattern with spread
above and below the level of needle placement, primarily on the
right, but to both sides. No vascular opacification is seen.

THERAPEUTIC EPIDURAL INJECTION:

One hundred twenty Mg of Depo-Medrol mixed with 2.5 cc 1% lidocaine
were instilled. The procedure was well-tolerated, and the patient
was discharged thirty minutes following the injection in good
condition.

COMPLICATIONS:
None
IMPRESSION: Technically successful epidural injection on the right at L4-5 # 1.

I think there is considerable potential that his lumbago is
primarily secondary to disc degeneration at L5-S1 with pronounced
reactive discogenic edema within the L5 and S1 vertebral bodies.
This responds variably to epidural injection, and may actually be
more successfully treated with systemic steroids. Luckily, this
should be a time limited process and as the marrow changes progress
to chronic stages, the pain should diminish.

## 2017-02-14 ENCOUNTER — Encounter: Payer: Self-pay | Admitting: Family Medicine

## 2017-04-03 ENCOUNTER — Other Ambulatory Visit: Payer: Self-pay | Admitting: Family Medicine

## 2017-04-03 NOTE — Telephone Encounter (Signed)
Refill done.  

## 2017-10-11 NOTE — Progress Notes (Signed)
Tawana ScaleZach Smith D.O. [facility] [address] [place], [place] [address] Phone: [phone] Subjective:    I'm seeing this patient by the request  of:    CC: Low back pain  BJY:[id number]:Subjective  Dannielle HuhJonathan West is a 47 y.o. male coming in with complaint of low back pain.  Patient was found to have a small broad-based disc bulge at L4-L5 and chronic degenerative disc disease at L5-S1.  This was independently visualized by me on MRI.  Patient has responded well to oral prednisone and has had one epidural back in August 2017.  Patient states that he has had a steady build of pain. He has constant pain and intermittent muscle spasms.  Now starting to affect daily activities.  Affecting some of the range of motion.     Past Medical History:  Diagnosis Date  . Arthritis    lower back, right ankle   No past surgical history on file. Social History   Socioeconomic History  . Marital status: Single    Spouse name: None  . Number of children: None  . Years of education: None  . Highest education level: None  Social Needs  . Financial resource strain: None  . Food insecurity - worry: None  . Food insecurity - inability: None  . Transportation needs - medical: None  . Transportation needs - non-medical: None  Occupational History  . None  Tobacco Use  . Smoking status: Current Every Day Smoker    Packs/day: 0.25    Types: Cigars  . Smokeless tobacco: Never Used  Substance and Sexual Activity  . Alcohol use: Yes  . Drug use: No  . Sexual activity: None  Other Topics Concern  . None  Social History Narrative  . None   No Known Allergies Family History  Problem Relation Age of Onset  . Stroke Father   . Diabetes Father   . Heart disease Father   . Depression Sister   . Bipolar disorder Sister      Past medical history, social, surgical and family history all reviewed in electronic medical record.  No pertanent information unless stated regarding to the chief  complaint.   Review of Systems:Review of systems updated and as accurate as of 10/13/17  No headache, visual changes, nausea, vomiting, diarrhea, constipation, dizziness, abdominal pain, skin rash, fevers, chills, night sweats, weight loss, swollen lymph nodes, body aches, joint swelling, , chest pain, shortness of breath, mood changes.  Positive muscle aches  Objective  Blood pressure 110/76, pulse 84, height 5\' 11"  (1.803 m), weight 160 lb (72.6 kg), SpO2 96 %. Systems examined below as of 10/13/17   General: No apparent distress alert and oriented x3 mood and affect normal, dressed appropriately.  HEENT: Pupils equal, extraocular movements intact  Respiratory: Patient's speak in full sentences and does not appear short of breath  Cardiovascular: No lower extremity edema, non tender, no erythema  Skin: Warm dry intact with no signs of infection or rash on extremities or on axial skeleton.  Abdomen: Soft nontender  Neuro: Cranial nerves II through XII are intact, neurovascularly intact in all extremities with 2+ DTRs and 2+ pulses.  Lymph: No lymphadenopathy of posterior or anterior cervical chain or axillae bilaterally.  Gait normal with good balance and coordination.  MSK:  Non tender with full range of motion and good stability and symmetric strength and tone of shoulders, elbows, wrist, hip, knee and ankles bilaterally.  Back Exam:  Inspection: Loss of lordosis Motion: Flexion 25  deg, Extension 25 deg, Side Bending to 35 deg bilaterally,  Rotation to 35 deg bilaterally  SLR laying: Negative  XSLR laying: Negative  Palpable tenderness: Tender to palpation in the paraspinal musculature lumbar spine right greater than left. FABER: Mild tightness in the left. Sensory change: Gross sensation intact to all lumbar and sacral dermatomes.  Reflexes: 2+ at both patellar tendons, 2+ at achilles tendons, Babinski's downgoing.  Strength at foot  Plantar-flexion: 5/5 Dorsi-flexion: 5/5 Eversion:  5/5 Inversion: 5/5  Leg strength  Quad: 5/5 Hamstring: 5/5 Hip flexor: 5/5 Hip abductors: 5/5  Gait unremarkable.    Impression and Recommendations:     This case required medical decision making of moderate complexity.      Note: This dictation was prepared with Dragon dictation along with smaller phrase technology. Any transcriptional errors that result from this process are unintentional.

## 2017-10-13 ENCOUNTER — Ambulatory Visit: Payer: 59 | Admitting: Family Medicine

## 2017-10-13 ENCOUNTER — Encounter: Payer: Self-pay | Admitting: Family Medicine

## 2017-10-13 VITALS — BP 110/76 | HR 84 | Ht 71.0 in | Wt 160.0 lb

## 2017-10-13 DIAGNOSIS — M5416 Radiculopathy, lumbar region: Secondary | ICD-10-CM | POA: Diagnosis not present

## 2017-10-13 DIAGNOSIS — M5136 Other intervertebral disc degeneration, lumbar region: Secondary | ICD-10-CM | POA: Diagnosis not present

## 2017-10-13 MED ORDER — PREDNISONE 50 MG PO TABS: 50.0000 mg | ORAL_TABLET | Freq: Every day | ORAL | 0 refills | Status: DC

## 2017-10-13 NOTE — Assessment & Plan Note (Signed)
Continues to have some difficulty with this.  Prednisone given again.  We discussed the possibility of an epidural.  Used to use a muscle relaxer and will once weekly vitamin D.  Not using anymore at this time.  We discussed icing regimen.  Follow-up again with me 2 weeks after the epidural

## 2017-10-13 NOTE — Patient Instructions (Addendum)
Good to see you  We will do the prednisone again  Can consider an injection and they will call you  Ice 20 minutes 2 times daily. Usually after activity and before bed. Send me a message 2 weeks after the injection  See me again when you need me

## 2017-11-05 ENCOUNTER — Encounter: Payer: Self-pay | Admitting: Family Medicine

## 2018-12-23 ENCOUNTER — Telehealth: Payer: 59 | Admitting: Family

## 2018-12-23 DIAGNOSIS — J069 Acute upper respiratory infection, unspecified: Secondary | ICD-10-CM

## 2018-12-23 MED ORDER — PROMETHAZINE-DM 6.25-15 MG/5ML PO SYRP: 5.0000 mL | ORAL_SOLUTION | Freq: Four times a day (QID) | ORAL | 0 refills | Status: DC | PRN

## 2018-12-23 NOTE — Progress Notes (Signed)
E-Visit for Corona Virus Screening Based on your current symptoms, it seems unlikely that your symptoms are related to the Corona virus.   Coronavirus disease 2019 (COVID-19) is a respiratory illness that can spread from person to person. The virus that causes COVID-19 is a new virus that was first identified in the country of [place] but is now found in multiple other countries and has spread to the [place].  Symptoms associated with the virus are mild to severe fever, cough, and shortness of breath. There is currently no vaccine to protect against COVID-19, and there is no specific antiviral treatment for the virus.  It is vitally important that if you feel that you have an infection such as this virus or any other virus that you stay home and away from places where you may spread it to others.  Currently, not all patients are being tested. If the symptoms are mild and there is not a known exposure, performing the test is not indicated.  You can use medication such as A prescription cough medication called Phenergan DM 6.25 mg/15 mg. You make take one teaspoon / 5 ml every 4-6 hours as needed for cough  Reduce your risk of any infection by using the same precautions used for avoiding the common cold or flu:  . Wash your hands often with soap and warm water for at least 20 seconds.  If soap and water are not readily available, use an alcohol-based hand sanitizer with at least 60% alcohol.  . If coughing or sneezing, cover your mouth and nose by coughing or sneezing into the elbow areas of your shirt or coat, into a tissue or into your sleeve (not your hands). . Avoid shaking hands with others and consider head nods or verbal greetings only. . Avoid touching your eyes, nose, or mouth with unwashed hands.  . Avoid close contact with people who are sick. . Avoid places or events with large numbers of people in one location, like concerts or sporting events. . Carefully consider travel plans you  have or are making. . If you are planning any travel outside or inside the [place], visit the CDC's Travelers' Health webpage for the latest health notices. . If you have some symptoms but not all symptoms, continue to monitor at home and seek medical attention if your symptoms worsen. . If you are having a medical emergency, call 911.  HOME CARE . Only take medications as instructed by your medical team. . Drink plenty of fluids and get plenty of rest. . A steam or ultrasonic humidifier can help if you have congestion.   GET HELP RIGHT AWAY IF: . You develop worsening fever. . You become short of breath . You cough up blood. . Your symptoms become more severe MAKE SURE YOU   Understand these instructions.  Will watch your condition.  Will get help right away if you are not doing well or get worse.  Your e-visit answers were reviewed by a board certified advanced clinical practitioner to complete your personal care plan.  Depending on the condition, your plan could have included both over the counter or prescription medications.  If there is a problem please reply once you have received a response from your provider. Your safety is important to us.  If you have drug allergies check your prescription carefully.    You can use MyChart to ask questions about today's visit, request a non-urgent call back, or ask for a work or school excuse for 24   hours related to this e-Visit. If it has been greater than 24 hours you will need to follow up with your provider, or enter a new e-Visit to address those concerns. You will get an e-mail in the next two days asking about your experience.  I hope that your e-visit has been valuable and will speed your recovery. Thank you for using e-visits.

## 2019-02-19 NOTE — Progress Notes (Signed)
Greater than 5 minutes, yet less than 10 minutes of time have been spent researching, coordinating, and implementing care for this patient today.  Thank you for the details you included in the comment boxes. Those details are very helpful in determining the best course of treatment for you and help us to provide the best care.  

## 2019-11-04 ENCOUNTER — Ambulatory Visit (INDEPENDENT_AMBULATORY_CARE_PROVIDER_SITE_OTHER)
Admission: RE | Admit: 2019-11-04 | Discharge: 2019-11-04 | Disposition: A | Discharge status: Home / Self Care | Payer: 59 | Source: Ambulatory Visit

## 2019-11-04 ENCOUNTER — Other Ambulatory Visit: Payer: Self-pay

## 2019-11-04 DIAGNOSIS — G8929 Other chronic pain: Secondary | ICD-10-CM | POA: Diagnosis not present

## 2019-11-04 DIAGNOSIS — M545 Low back pain, unspecified: Secondary | ICD-10-CM

## 2019-11-04 DIAGNOSIS — M546 Pain in thoracic spine: Secondary | ICD-10-CM

## 2019-11-04 MED ORDER — PREDNISONE 50 MG PO TABS: 50.0000 mg | ORAL_TABLET | Freq: Every day | ORAL | 0 refills | Status: DC

## 2019-11-04 MED ORDER — TIZANIDINE HCL 2 MG PO CAPS: 4.0000 mg | ORAL_CAPSULE | Freq: Three times a day (TID) | ORAL | 2 refills | Status: DC

## 2019-11-04 NOTE — ED Provider Notes (Signed)
Virtual Visit via Video Note:  Drew West  initiated request for Telemedicine visit with [facility] Urgent Care team. I connected with Drew West  on 11/04/2019 at 3:36 PM  for a synchronized telemedicine visit using a video enabled HIPPA compliant telemedicine application. I verified that I am speaking with Drew West  using two identifiers. Janace Aris, NP  was physically located in a [facility] Urgent care site and Drew West was located at a different location.   The limitations of evaluation and management by telemedicine as well as the availability of in-person appointments were discussed. Patient was informed that he  may incur a bill ( including co-pay) for this virtual visit encounter. Drew West  expressed understanding and gave verbal consent to proceed with virtual visit.     History of Present Illness:Drew West  is a 49 y.o. male presents with lower back pain. Has hx of DDD and this flares up from time to time.last flare was in 2018.  Mild swelling in the lower back with spasm. Symptoms have been present for a few days and worsening. No new injuries. No numbness,tingling, weakness in extremities. No loss of bowel or bladder function. No fever.   Past Medical History:  Diagnosis Date  . Arthritis    lower back, right ankle    No Known Allergies      Observations/Objective: VITALS: Per patient if applicable, see vitals. GENERAL: Alert, appears well and in no acute distress. HEENT: Atraumatic, conjunctiva clear, no obvious abnormalities on inspection of external nose and ears. NECK: Normal movements of the head and neck. CARDIOPULMONARY: No increased WOB. Speaking in clear sentences. I:E ratio WNL.  MS: Moves all visible extremities without noticeable abnormality. PSYCH: Pleasant and cooperative, well-groomed. Speech normal rate and rhythm. Affect is appropriate. Insight and judgement are appropriate. Attention is focused, linear, and  appropriate.  NEURO: CN grossly intact. Oriented as arrived to appointment on time with no prompting. Moves both UE equally.  SKIN: No obvious lesions, wounds, erythema, or cyanosis noted on face or hands.     Assessment and Plan: low back pain- treating with prednisone x 5 days and zanaflex as needed.     Follow Up Instructions: follow up in person if symptoms continue or worsen.     I discussed the assessment and treatment plan with the patient. The patient was provided an opportunity to ask questions and all were answered. The patient agreed with the plan and demonstrated an understanding of the instructions.   The patient was advised to call back or seek an in-person evaluation if the symptoms worsen or if the condition fails to improve as anticipated.     Janace Aris, NP  11/04/2019 3:36 PM         Janace Aris, NP 11/04/19 1536

## 2019-11-04 NOTE — Discharge Instructions (Addendum)
Follow up with ortho for any continued problems.  Take the medication as prescribed

## 2020-02-28 ENCOUNTER — Ambulatory Visit (INDEPENDENT_AMBULATORY_CARE_PROVIDER_SITE_OTHER)
Admission: RE | Admit: 2020-02-28 | Discharge: 2020-02-28 | Disposition: A | Discharge status: Home / Self Care | Payer: 59 | Source: Ambulatory Visit

## 2020-02-28 DIAGNOSIS — M545 Low back pain, unspecified: Secondary | ICD-10-CM

## 2020-02-28 DIAGNOSIS — G8929 Other chronic pain: Secondary | ICD-10-CM | POA: Diagnosis not present

## 2020-02-28 MED ORDER — PREDNISONE 50 MG PO TABS: 50.0000 mg | ORAL_TABLET | Freq: Every day | ORAL | 0 refills | Status: AC

## 2020-02-28 NOTE — ED Provider Notes (Signed)
Virtual Visit via Video Note:  Drew West  initiated request for Telemedicine visit with [facility] Urgent Care team. I connected with Drew West  on 02/28/2020 at 10:40 AM  for a synchronized telemedicine visit using a video enabled HIPPA compliant telemedicine application. I verified that I am speaking with Drew West  using two identifiers. Drew Bail, NP  was physically located in a [facility] Urgent care site and Drew West was located at a different location.   The limitations of evaluation and management by telemedicine as well as the availability of in-person appointments were discussed. Patient was informed that he  may incur a bill ( including co-pay) for this virtual visit encounter. Drew West  expressed understanding and gave verbal consent to proceed with virtual visit.     History of Present Illness:Drew West  is a 49 y.o. male presents for evaluation of chronic lower back pain.  He had a back spasm this morning and feels that the area is swollen.  He states this usually clears up with oral steroids; last taken January 2021.  He denies numbness, tingling, weakness in LE.  He denies fever or chills.  He denies abdominal pain or dysuria.  No other symptoms.     No Known Allergies   Past Medical History:  Diagnosis Date  . Arthritis    lower back, right ankle     Social History   Tobacco Use  . Smoking status: Current Every Day Smoker    Packs/day: 0.25    Types: Cigars  . Smokeless tobacco: Never Used  Substance Use Topics  . Alcohol use: Yes  . Drug use: No   ROS: as stated in HPI.  All other systems reviewed and negative.      Observations/Objective: Physical Exam  VITALS: Patient denies fever. GENERAL: Alert, appears well and in no acute distress. HEENT: Atraumatic. NECK: Normal movements of the head and neck. CARDIOPULMONARY: No increased WOB. Speaking in clear sentences. I:E ratio WNL.  MS: Moves all visible  extremities without noticeable abnormality. PSYCH: Pleasant and cooperative, well-groomed. Speech normal rate and rhythm. Affect is appropriate. Insight and judgement are appropriate. Attention is focused, linear, and appropriate.  NEURO: CN grossly intact. Oriented as arrived to appointment on time with no prompting. Moves both UE equally.  SKIN: No obvious lesions, wounds, erythema, or cyanosis noted on face or hands.   Assessment and Plan:    ICD-10-CM   1. Acute exacerbation of chronic low back pain  M54.5    G89.29        Follow Up Instructions: Treating with prednisone.  Suggested Tylenol or ibuprofen as needed for discomfort.  Instructed patient to follow-up with his PCP or come here to be seen in person if his symptoms or not improving.  Patient agrees to plan of care.      I discussed the assessment and treatment plan with the patient. The patient was provided an opportunity to ask questions and all were answered. The patient agreed with the plan and demonstrated an understanding of the instructions.   The patient was advised to call back or seek an in-person evaluation if the symptoms worsen or if the condition fails to improve as anticipated.      Drew Bail, NP  02/28/2020 10:40 AM         Drew Bail, NP 02/28/20 1040

## 2020-02-28 NOTE — Discharge Instructions (Addendum)
Take the prednisone as directed.    Follow up with your primary care provider or come here to be seen in person if your symptoms are not improving.     

## 2020-03-01 ENCOUNTER — Ambulatory Visit: Payer: 59 | Admitting: Family Medicine

## 2020-03-01 ENCOUNTER — Encounter: Payer: Self-pay | Admitting: Family Medicine

## 2020-03-01 ENCOUNTER — Other Ambulatory Visit: Payer: Self-pay

## 2020-03-01 VITALS — BP 110/78 | HR 73 | Ht 71.0 in | Wt 179.6 lb

## 2020-03-01 DIAGNOSIS — M62830 Muscle spasm of back: Secondary | ICD-10-CM

## 2020-03-01 DIAGNOSIS — M5136 Other intervertebral disc degeneration, lumbar region: Secondary | ICD-10-CM | POA: Diagnosis not present

## 2020-03-01 DIAGNOSIS — S39012A Strain of muscle, fascia and tendon of lower back, initial encounter: Secondary | ICD-10-CM

## 2020-03-01 MED ORDER — PREDNISONE 5 MG (48) PO TBPK: ORAL_TABLET | ORAL | 0 refills | Status: AC

## 2020-03-01 NOTE — Progress Notes (Signed)
   I, Christoper Fabian, LAT, ATC, am serving as scribe for Dr. Clementeen Graham.  Drew West is a 49 y.o. male who presents to [facility] today for f/u of low back pain.  He was last seen by Dr. Katrinka Blazing on 10/13/17 and was prescribed a prednisone dose pack.  He then had a R L4-5 epidural on 05/29/16.  Most recently he had an Urgent Care video visit on 02/28/20 w/ an acute exacerbation of low back pain and spasm and was advised to use Tylenol or IBU.  Since then, pt reports that his back spasmed this past Monday morning when he was leaning forward to put something in his glove box.  He denies any radiating symptoms into his B LEs.  He got a prescription for prednisone on Monday for 50mg  x 5 days.     Diagnostic testing: L-spine MRI- 04/29/16    Pertinent review of systems: No fevers or chills  Relevant historical information: History of vitamin D deficiency.  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   Exam:  BP 110/78 (BP Location: Right Arm, Patient Position: Sitting, Cuff Size: Normal)   Pulse 73   Ht 5\' 11"  (1.803 m)   Wt 179 lb 9.6 oz (81.5 kg)   SpO2 96%   BMI 25.05 kg/m  General: Well Developed, well nourished, and in no acute distress.   MSK: L-spine normal-appearing Nontender midline. Nontender paraspinal musculature. Range of motion limited rotation lateral flexion normal extension and flexion. Lower extremity strength reflexes and sensation are equal normal throughout bilateral lower extremities. Normal gait.  MRI L-spine images from July 2017 personally and independently reviewed showing broad-based disc bulge at L4-5 and DDD at L5-S1.   Assessment and Plan: 49 y.o. male with lumbosacral strain.  Currently receiving prednisone.  He had good results with this in the past which is a bit atypical for typical low back strain.  Anticipate that he will improve without much further treatment however did discussed backup  plan.  Plan to refer to physical therapy externally for use especially if not better.  Also prescribed a tapering prednisone Dosepak to take especially if not better.  Discussed muscle relaxers.  He has had very poor results with these previously so we will hold off on them in the future. Recommend TENS unit and did discuss heating pad.  If not better after the above treatment would consider repeat imaging likely x-ray MRI lumbar spine for injection planning.  Patient will keep me updated.   PDMP not reviewed this encounter. Orders Placed This Encounter  Procedures  . Ambulatory referral to Physical Therapy    Referral Priority:   Routine    Referral Type:   Physical Medicine    Referral Reason:   Specialty Services Required    Requested Specialty:   Physical Therapy    Number of Visits Requested:   1   Meds ordered this encounter  Medications  . predniSONE (STERAPRED UNI-PAK 48 TAB) 5 MG (48) TBPK tablet    Sig: 12 day dosepack po    Dispense:  48 tablet    Refill:  0     Discussed warning signs or symptoms. Please see discharge instructions. Patient expresses understanding.   The above documentation has been reviewed and is accurate and complete August 2017, M.D.

## 2020-03-01 NOTE — Patient Instructions (Addendum)
Thank you for coming in today. OK to take the tapering prednisone if needed.  Schedule PT in the future if you can.  Let me know where you schedule so I can send note.  Keep me updated if not better.   Try a TENS unit.  TENS UNIT: This is helpful for muscle pain and spasm.   Search and Purchase a TENS 7000 2nd edition at  www.tenspros.com or www.[employer].com It should be less than $30.     TENS unit instructions: Do not shower or bathe with the unit on Turn the unit off before removing electrodes or batteries If the electrodes lose stickiness add a drop of water to the electrodes after they are disconnected from the unit and place on plastic sheet. If you continued to have difficulty, call the TENS unit company to purchase more electrodes. Do not apply lotion on the skin area prior to use. Make sure the skin is clean and dry as this will help prolong the life of the electrodes. After use, always check skin for unusual red areas, rash or other skin difficulties. If there are any skin problems, does not apply electrodes to the same area. Never remove the electrodes from the unit by pulling the wires. Do not use the TENS unit or electrodes other than as directed. Do not change electrode placement without consultating your therapist or physician. Keep 2 fingers with between each electrode. Wear time ratio is 2:1, on to off times.    For example on for 30 minutes off for 15 minutes and then on for 30 minutes off for 15 minutes    This will typically for most people resolve in 1-3 weeks.  Staying somewhat active is better than bedrest.    Lumbosacral Strain Lumbosacral strain is an injury that causes pain in the lower back (lumbosacral spine). This injury usually happens from overstretching the muscles or ligaments along your spine. Ligaments are cord-like tissues that connect bones to other bones. A strain can affect one or more muscles or ligaments. What are the causes? This condition  may be caused by:  A hard, direct hit to the back.  Overstretching the lower back muscles. This may result from: ? A fall. ? Lifting something heavy. ? Repetitive movements such as bending or crouching. What increases the risk? The following factors may make you more likely to develop this condition:  Participating in sports or activities that involve: ? A sudden twist of the back. ? Pushing or pulling motions.  Being overweight or obese.  Having poor strength and flexibility, especially tight hamstrings or weak muscles in the back or abdomen.  Having too much of a curve in the lower back.  Having a pelvis that is tilted forward. What are the signs or symptoms? The main symptom of this condition is pain in the lower back, at the site of the strain. Pain may also be felt down one or both legs. How is this diagnosed? This condition is diagnosed based on your symptoms, your medical history, and a physical exam. During the physical exam, your health care provider may push on certain areas of your back to find the source of your pain. You may be asked to bend forward, backward, and side to side to check your pain and range of motion. You may also have imaging tests, such as X-rays and an MRI. How is this treated? This condition may be treated by:  Applying heat and cold on the affected area.  Taking medicines to  help relieve pain and relax your muscles.  Taking NSAIDs, such as ibuprofen, to help reduce swelling and discomfort.  Doing stretching and strengthening exercises for your lower back. Symptoms usually improve within several weeks of treatment. However, recovery time varies. When your symptoms improve, gradually return to your normal routine as soon as possible to reduce pain, avoid stiffness, and keep muscle strength. Follow these instructions at home: Medicines  Take over-the-counter and prescription medicines only as told by your health care provider.  Ask your health  care provider if the medicine prescribed to you: ? Requires you to avoid driving or using heavy machinery. ? Can cause constipation. You may need to take these actions to prevent or treat constipation:  Drink enough fluid to keep your urine pale yellow.  Take over-the-counter or prescription medicines.  Eat foods that are high in fiber, such as beans, whole grains, and fresh fruits and vegetables.  Limit foods that are high in fat and processed sugars, such as fried or sweet foods. Managing pain, stiffness, and swelling      If directed, put ice on the injured area. To do this: ? Put ice in a plastic bag. ? Place a towel between your skin and the bag. ? Leave the ice on for 20 minutes, 2-3 times a day.  If directed, apply heat on the affected area as often as told by your health care provider. Use the heat source that your health care provider recommends, such as a moist heat pack or a heating pad. ? Place a towel between your skin and the heat source. ? Leave the heat on for 20-30 minutes. ? Remove the heat if your skin turns bright red. This is especially important if you are unable to feel pain, heat, or cold. You may have a greater risk of getting burned. Activity  Rest as told by your health care provider.  Do not stay in bed. Staying in bed for more than 1-2 days can delay your recovery.  Return to your normal activities as told by your health care provider. Ask your health care provider what activities are safe for you.  Avoid activities that take a lot of energy for as long as told by your health care provider.  Do exercises as told by your health care provider. This includes stretching and strengthening exercises. General instructions  Sit up and stand up straight. Avoid leaning forward when you sit, or hunching over when you stand.  Do not use any products that contain nicotine or tobacco, such as cigarettes, e-cigarettes, and chewing tobacco. If you need help  quitting, ask your health care provider.  Keep all follow-up visits as told by your health care provider. This is important. How is this prevented?   Use correct form when playing sports and lifting heavy objects.  Use good posture when sitting and standing.  Maintain a healthy weight.  Sleep on a mattress with medium firmness to support your back.  Do at least 150 minutes of moderate-intensity exercise each week, such as brisk walking or water aerobics. Try a form of exercise that takes stress off your back, such as swimming or stationary cycling.  Maintain physical fitness, including: ? Strength. ? Flexibility. Contact a health care provider if:  Your back pain does not improve after several weeks of treatment.  Your symptoms get worse. Get help right away if:  Your back pain is severe.  You cannot stand or walk.  You have difficulty controlling when you urinate or  when you have a bowel movement.  You feel nauseous or you vomit.  Your feet or legs get very cold, turn pale, or look blue.  You have numbness, tingling, weakness, or problems using your arms or legs.  You develop any of the following: ? Shortness of breath. ? Dizziness. ? Pain in your legs. ? Weakness in your buttocks or legs. Summary  Lumbosacral strain is an injury that causes pain in the lower back (lumbosacral spine).  This injury usually happens from overstretching the muscles or ligaments along your spine.  This condition may be caused by a direct hit to the lower back or by overstretching the lower back muscles.  Symptoms usually improve within several weeks of treatment. This information is not intended to replace advice given to you by your health care provider. Make sure you discuss any questions you have with your health care provider. Document Revised: 02/16/2019 Document Reviewed: 02/16/2019 Elsevier Patient Education  2020 [employer].

## 2022-05-13 ENCOUNTER — Emergency Department (HOSPITAL_BASED_OUTPATIENT_CLINIC_OR_DEPARTMENT_OTHER): Payer: 59

## 2022-05-13 ENCOUNTER — Encounter (HOSPITAL_BASED_OUTPATIENT_CLINIC_OR_DEPARTMENT_OTHER): Payer: Self-pay

## 2022-05-13 ENCOUNTER — Emergency Department (HOSPITAL_BASED_OUTPATIENT_CLINIC_OR_DEPARTMENT_OTHER)
Admission: EM | Admit: 2022-05-13 | Discharge: 2022-05-13 | Disposition: A | Discharge status: Home / Self Care | Payer: 59 | Attending: Emergency Medicine | Admitting: Emergency Medicine

## 2022-05-13 DIAGNOSIS — R079 Chest pain, unspecified: Secondary | ICD-10-CM

## 2022-05-13 LAB — CBC
HCT: 46.5 % (ref 39.0–52.0)
Hemoglobin: 16.4 g/dL (ref 13.0–17.0)
MCH: 31.5 pg (ref 26.0–34.0)
MCHC: 35.3 g/dL (ref 30.0–36.0)
MCV: 89.3 fL (ref 80.0–100.0)
Platelets: 293 10*3/uL (ref 150–400)
RBC: 5.21 MIL/uL (ref 4.22–5.81)
RDW: 12.1 % (ref 11.5–15.5)
WBC: 8.8 10*3/uL (ref 4.0–10.5)
nRBC: 0 % (ref 0.0–0.2)

## 2022-05-13 LAB — TROPONIN I (HIGH SENSITIVITY)
Troponin I (High Sensitivity): 3 ng/L (ref <18)
Troponin I (High Sensitivity): 3 ng/L (ref <18)

## 2022-05-13 LAB — BASIC METABOLIC PANEL
Anion gap: 8 (ref 5–15)
BUN: 9 mg/dL (ref 6–20)
CO2: 29 mmol/L (ref 22–32)
Calcium: 9.2 mg/dL (ref 8.9–10.3)
Chloride: 102 mmol/L (ref 98–111)
Creatinine, Ser: 0.98 mg/dL (ref 0.61–1.24)
GFR, Estimated: >60 mL/min (ref >60)
Glucose, Bld: 110 mg/dL — ABNORMAL HIGH (ref 70–99)
Potassium: 3.4 mmol/L — ABNORMAL LOW (ref 3.5–5.1)
Sodium: 139 mmol/L (ref 135–145)

## 2022-05-13 LAB — D-DIMER, QUANTITATIVE: D-Dimer, Quant: <0.27 ug/mL-FEU (ref 0.00–0.50)

## 2022-05-13 MED ORDER — POTASSIUM CHLORIDE CRYS ER 20 MEQ PO TBCR
40.0000 meq | EXTENDED_RELEASE_TABLET | Freq: Once | ORAL | Status: AC
  Administered 2022-05-13: 40 meq via ORAL
  Filled 2022-05-13: qty 2

## 2022-05-13 NOTE — ED Provider Notes (Signed)
[facility] EMERGENCY DEPARTMENT Provider Note   CSN: [id number] Arrival date & time: 05/13/22  4008     History  Chief Complaint  Patient presents with  . Chest Pain    Drew West is a 51 y.o. male with a history of chronic low back pain and tobacco use disorder who presents with chest pain.  Pain began yesterday.  Its onset was gradual.  Seems to be provoked by movement.  He describes moving his arm a certain way which can exacerbate the pain.  It is also worse when walking.  Patient describes the pain as feeling like a "muscle spasm".  It is nonradiating.  It is 8 out of 10 at worst, now a 1 or 2 out of 10.  He denies shortness of breath, cough, chills, sweats, nausea, vomiting.  He endorses chronic diarrhea.  Family history significant for cardiovascular disease and diabetes in his father.  Social history: Drinks very rarely.  Used to smoke 3 packs a day.  Vapes daily.  Does not use drugs.  Travels a lot for work.   Chest Pain      Home Medications Prior to Admission medications   Medication Sig Start Date End Date Taking? Authorizing Provider  multivitamin-iron-minerals-folic acid (CENTRUM) chewable tablet Chew 1 tablet by mouth daily.   Yes [provider]  predniSONE (DELTASONE) 50 MG tablet Take 1 tablet (50 mg total) by mouth daily. 02/28/20   Mickie Bail, NP  predniSONE (STERAPRED UNI-PAK 48 TAB) 5 MG (48) TBPK tablet 12 day dosepack po 03/01/20   Rodolph Bong, MD      Allergies    Patient has no known allergies.    Review of Systems   Review of Systems  Cardiovascular:  Positive for chest pain.  All other systems reviewed and are negative.   Physical Exam Updated Vital Signs BP (!) 131/94   Pulse 77   Temp (!) 97 F (36.1 C)   Resp (!) 8   Ht 5\' 11"  (1.803 m)   Wt 73.5 kg   SpO2 99%   BMI 22.59 kg/m  Physical Exam Vitals and nursing note reviewed.  Constitutional:      General: He is not in acute distress.    Appearance:  He is well-developed.  HENT:     Head: Normocephalic and atraumatic.  Eyes:     Conjunctiva/sclera: Conjunctivae normal.  Cardiovascular:     Rate and Rhythm: Normal rate and regular rhythm.     Heart sounds: No murmur heard. Pulmonary:     Effort: Pulmonary effort is normal. No respiratory distress.     Breath sounds: Normal breath sounds.  Chest:     Chest wall: No deformity, tenderness or crepitus.  Abdominal:     Palpations: Abdomen is soft.     Tenderness: There is no abdominal tenderness.  Musculoskeletal:        General: No swelling.     Cervical back: Neck supple.  Skin:    General: Skin is warm and dry.     Capillary Refill: Capillary refill takes less than 2 seconds.  Neurological:     Mental Status: He is alert.  Psychiatric:        Mood and Affect: Mood normal.     ED Results / Procedures / Treatments   Labs (all labs ordered are listed, but only abnormal results are displayed) Labs Reviewed  BASIC METABOLIC PANEL  CBC  TROPONIN I (HIGH SENSITIVITY)    EKG EKG  Interpretation  Date/Time:  Monday May 13 2022 08:18:45 EDT Ventricular Rate:  81 PR Interval:  133 QRS Duration: 97 QT Interval:  377 QTC Calculation: 438 R Axis:   58 Text Interpretation: Sinus rhythm Low voltage, precordial leads Baseline wander in lead(s) V1 when compared to prior, some wandering baseline but no acute abnormalities seen. No STEMI Confirmed by Theda Belfast ([id number]) on 05/13/2022 8:22:45 AM  Radiology No results found.  Procedures Procedures  Continuous cardiac monitoring: NSR with frequent PVCs.  Medications Ordered in ED Medications - No data to display  ED Course/ Medical Decision Making/ A&P Clinical Course as of 05/13/22 1134  Mon May 13, 2022  7322 D-dimer negative.  Initial troponin negative.  CXR with minimal basilar infiltrates, suspect this is noncontributory to patient's current presentation. [MM]  [phone] HEART score of 3 with points for moderately  suspicious history, age between 40 and 81, and 2 risk factors.  Indicating low risk of 30-day major adverse cardiac events. [MM]  1132 Second troponin normal.  No delta.  Will discharge patient at this time. [MM]    Clinical Course User Index [MM] Marrianne Mood, MD                           Medical Decision Making Amount and/or Complexity of Data Reviewed Labs: ordered. Radiology: ordered.  Risk Prescription drug management.   Drew West is a 51 year old male with an extensive smoking history and family history positive for coronary artery disease who presents with a day of chest pain, possibly exertional.  This involves an extensive number of treatment options, and is a complaint that carries with it a high risk of complications and morbidity.  The differential diagnosis includes ACS, PE, pneumonia, lung cancer, costochondritis.   Co morbidities that complicate the patient evaluation  Tobacco use disorder   Social Determinants of Health:  Daily vape use Travels long distances for work   Lab Tests:  I Ordered (or co-signed), and personally interpreted labs.  The pertinent results include:   D-dimer: negative CBC: WNL Troponin: negative with no delta  Imaging Studies ordered:  I ordered (or co-signed) imaging studies including CXR  I independently visualized and interpreted imaging which showed no acute abnormality in chest I agree with the radiologist interpretation   Cardiac Monitoring:  The patient was maintained on a cardiac monitor.  The cardiac monitored showed an rhythm of NSR with frequent PVCs. The patient was also maintained on pulse oximetry. The readings were typically WINL.  Reevaluation:  After the interventions noted above, I reevaluated the patient and found that they have :stayed the same.    Dispostion:  After consideration of the diagnostic results and the patients response to treatment, I feel that the patent would benefit from  discharge and follow-up with PCP.           Final Clinical Impression(s) / ED Diagnoses Final diagnoses:  None    Rx / DC Orders ED Discharge Orders     None         Marrianne Mood, MD 05/13/22 1134    Tegeler, Canary Brim, MD 05/13/22 1141

## 2022-05-13 NOTE — ED Triage Notes (Signed)
Pt reports chest pain that began this morning. Reports a few pain yesterday. Feels like a muscle spasm per pt

## 2022-05-13 NOTE — Discharge Instructions (Signed)
Your work-up for chest pain was negative for acute, life-threatening causes like heart attack, blood clot in the lungs, and pneumonia.  I think it is safe for you to return home at this point.  If your symptoms do not improve or get worse, please return to the emergency department.  Be especially alert for signs and symptoms including, but not limited to: Shortness of breath, fevers, dizziness, fainting, confusion.

## 2022-05-13 NOTE — ED Notes (Signed)
ED Provider at bedside.
